# Patient Record
Sex: Female | Born: 1990 | Race: Black or African American | Hispanic: No | Marital: Single | Smoking: Never smoker
Health system: Southern US, Community
[De-identification: ages and names within clinical notes are randomized; demographics above are authoritative.]

## PROBLEM LIST (undated history)

## (undated) HISTORY — PX: BREAST SURGERY: SHX581

## (undated) HISTORY — PX: CHOLECYSTECTOMY: SHX55

## (undated) HISTORY — PX: INDUCED ABORTION: SHX677

---
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
## 2009-10-27 IMAGING — CR DG CHEST 2V
2 series · 2 of 2 positions shown · non-contrast
Comparison: None

CLINICAL DATA: Left-sided chest pain.

CHEST - 2 VIEW

[w chest pa]
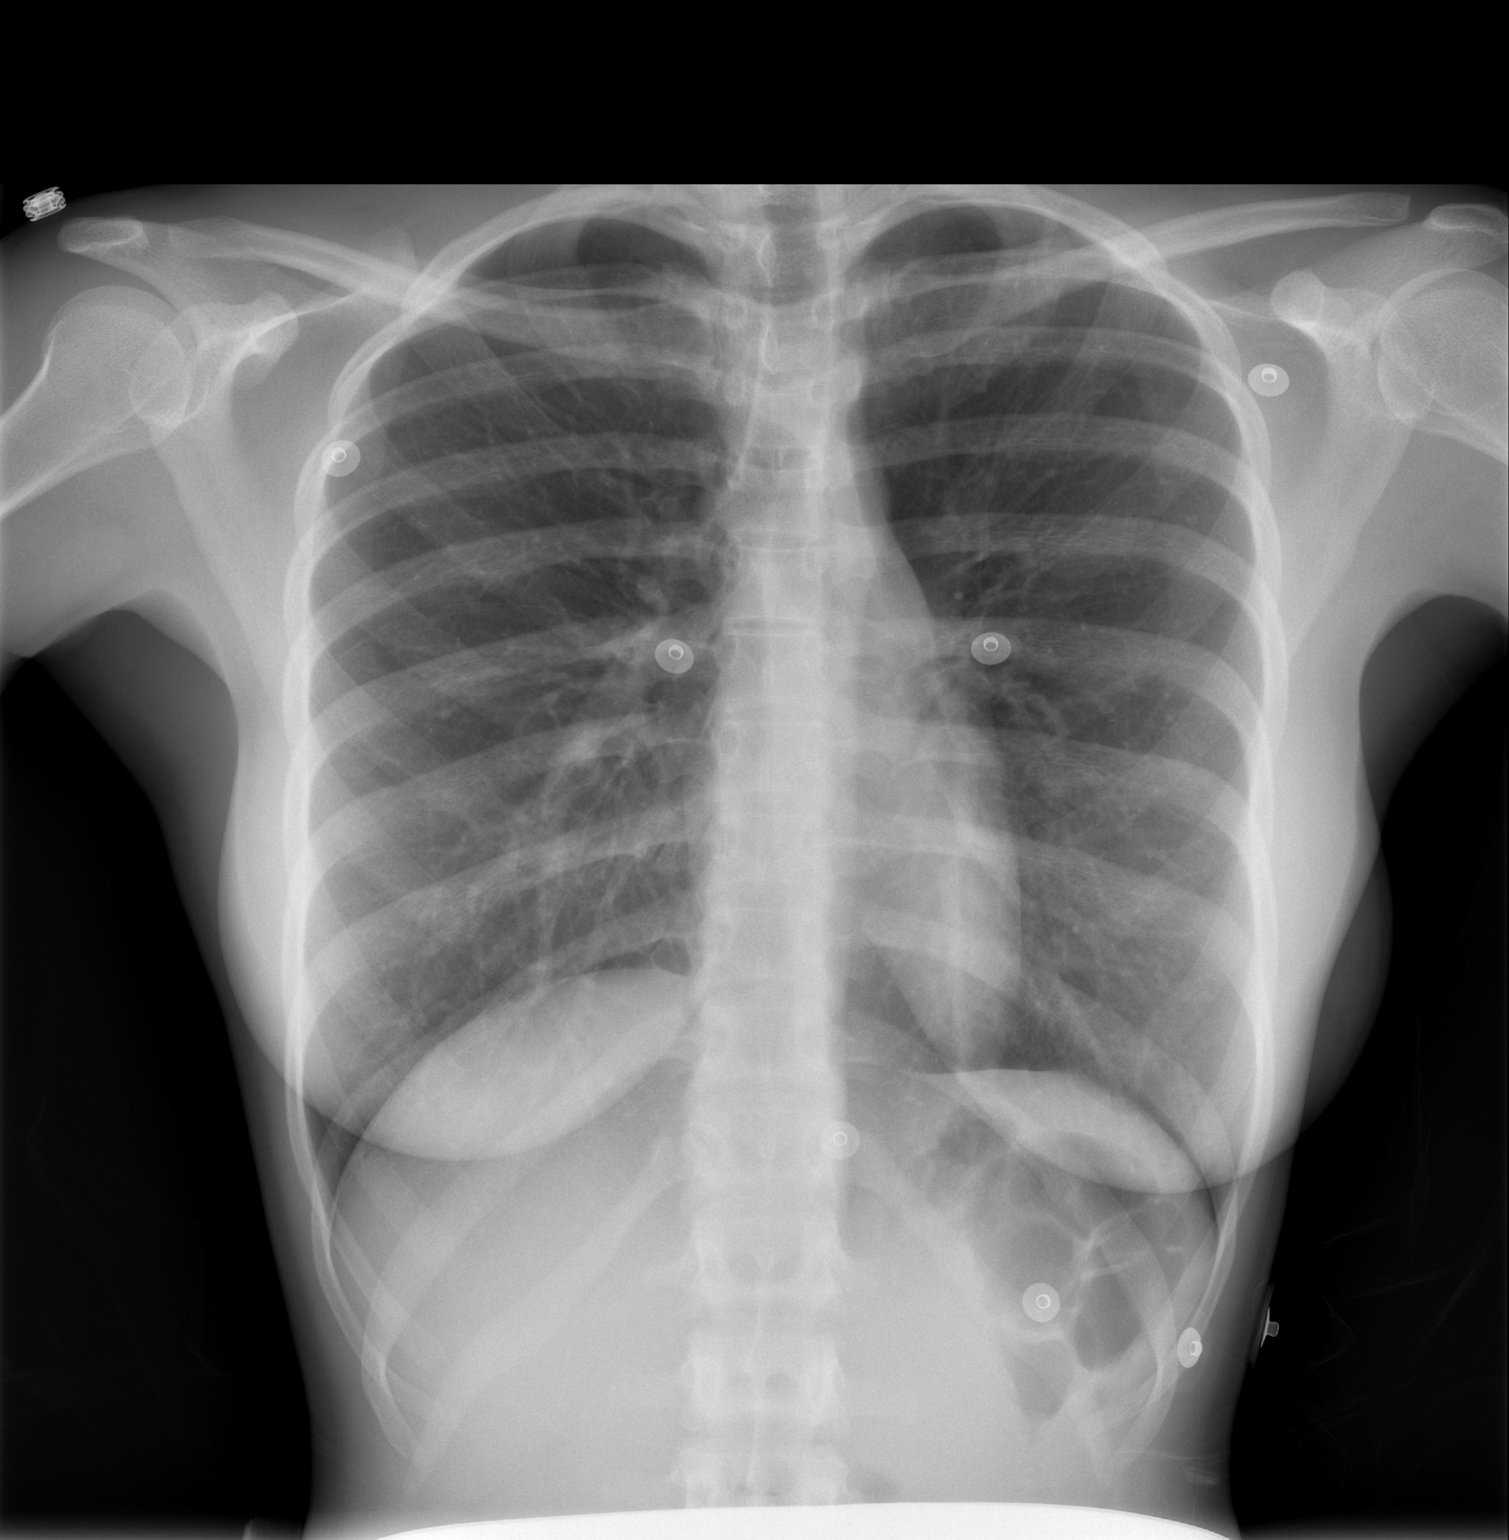

[w chest lat]
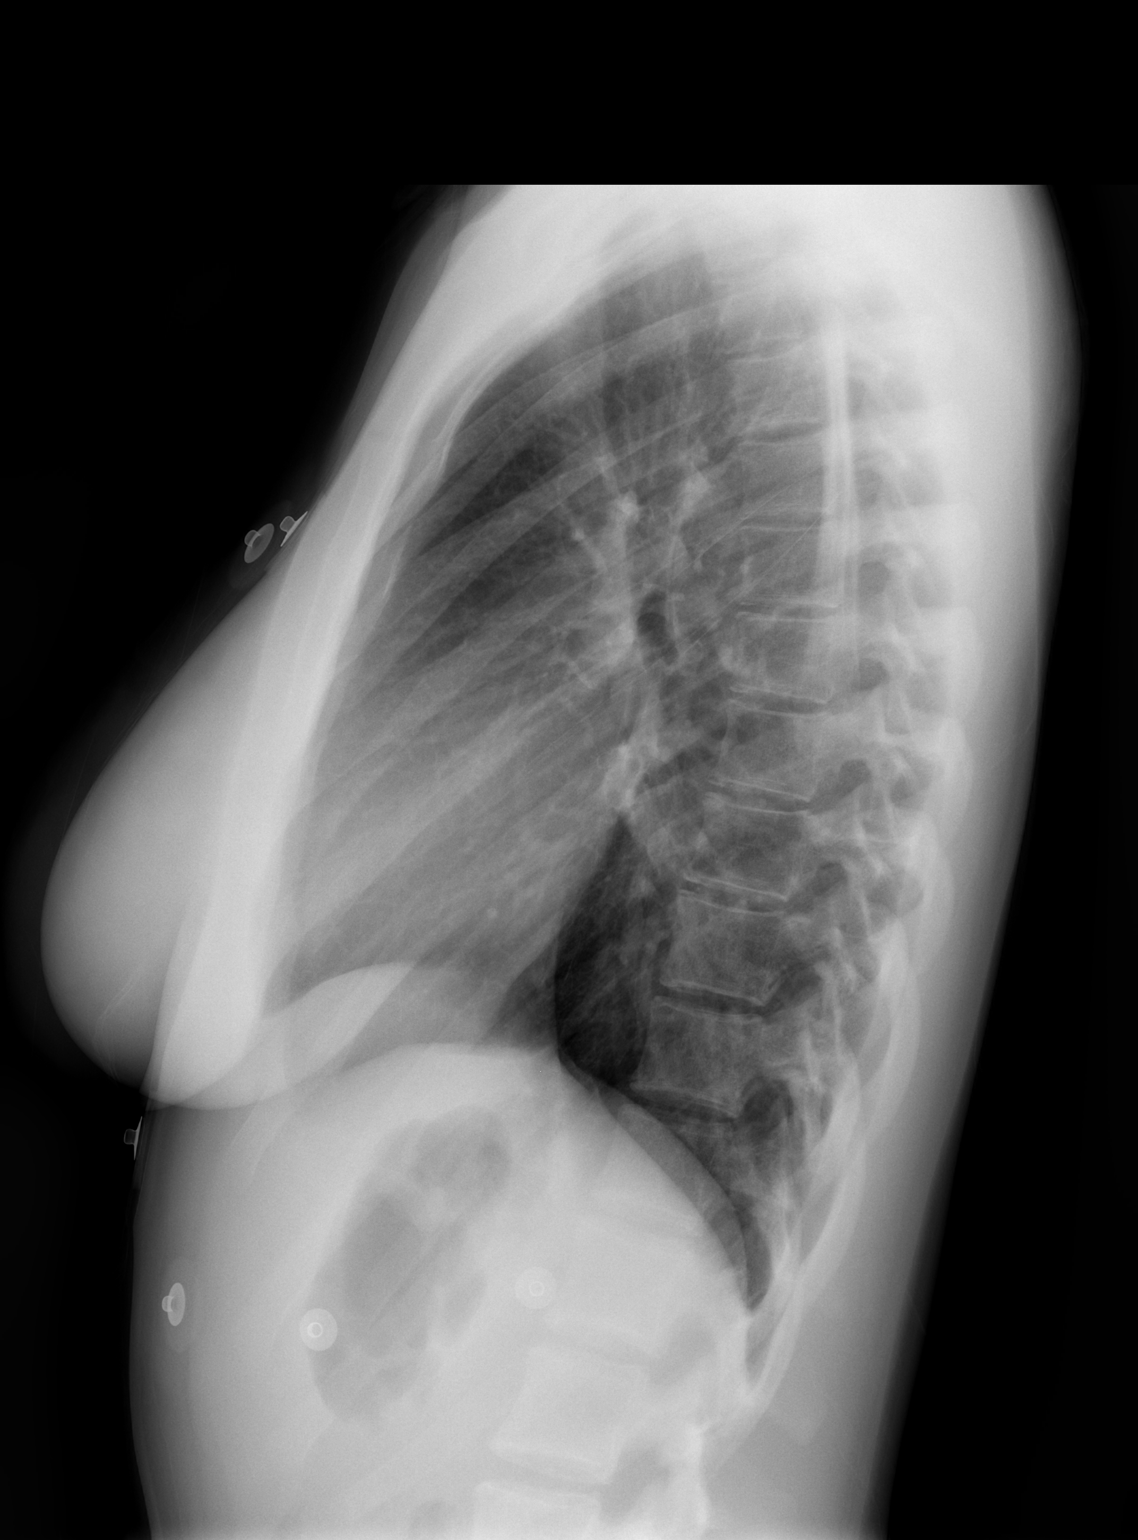

[2 of 2 positions shown; findings below may reference images not displayed]

FINDINGS: Heart and mediastinal contours are within normal limits.
No focal opacities or effusions.  No acute bony abnormality.
IMPRESSION: No active disease.

## 2010-08-18 ENCOUNTER — Emergency Department (HOSPITAL_BASED_OUTPATIENT_CLINIC_OR_DEPARTMENT_OTHER)
Admission: EM | Admit: 2010-08-18 | Discharge: 2010-08-18 | Disposition: A | Discharge status: Home / Self Care | Payer: BC Managed Care – PPO | Attending: Emergency Medicine | Admitting: Emergency Medicine

## 2010-08-18 DIAGNOSIS — Y92009 Unspecified place in unspecified non-institutional (private) residence as the place of occurrence of the external cause: Secondary | ICD-10-CM | POA: Insufficient documentation

## 2010-08-18 DIAGNOSIS — X118XXA Contact with other hot tap-water, initial encounter: Secondary | ICD-10-CM | POA: Insufficient documentation

## 2010-08-18 DIAGNOSIS — T2124XA Burn of second degree of lower back, initial encounter: Secondary | ICD-10-CM | POA: Insufficient documentation

## 2011-03-19 LAB — URINALYSIS, ROUTINE W REFLEX MICROSCOPIC
Ketones, ur: NEGATIVE
Nitrite: NEGATIVE
Protein, ur: NEGATIVE
Urobilinogen, UA: 0.2

## 2011-03-19 LAB — POCT CARDIAC MARKERS
CKMB, poc: 1
CKMB, poc: <1 — ABNORMAL LOW
Troponin i, poc: <0.05
Troponin i, poc: <0.05

## 2011-03-19 LAB — DIFFERENTIAL
Basophils Absolute: 0.1
Basophils Relative: 1
Eosinophils Relative: 2
Lymphocytes Relative: 40
Monocytes Absolute: 0.5
Monocytes Relative: 8
Neutro Abs: 3.1

## 2011-03-19 LAB — COMPREHENSIVE METABOLIC PANEL
AST: 24
Albumin: 4.4
Alkaline Phosphatase: 73
BUN: 10
Chloride: 106
GFR calc Af Amer: >60
Potassium: 3.8
Total Bilirubin: 0.3
Total Protein: 7.6

## 2011-03-19 LAB — CBC
Platelets: 271
RDW: 13.5
WBC: 6.2

## 2011-03-19 LAB — PREGNANCY, URINE: Preg Test, Ur: NEGATIVE

## 2013-02-28 ENCOUNTER — Emergency Department (HOSPITAL_BASED_OUTPATIENT_CLINIC_OR_DEPARTMENT_OTHER)
Admission: EM | Admit: 2013-02-28 | Discharge: 2013-02-28 | Disposition: A | Discharge status: Home / Self Care | Payer: BC Managed Care – PPO | Attending: Emergency Medicine | Admitting: Emergency Medicine

## 2013-02-28 ENCOUNTER — Encounter (HOSPITAL_BASED_OUTPATIENT_CLINIC_OR_DEPARTMENT_OTHER): Payer: Self-pay | Admitting: Emergency Medicine

## 2013-02-28 DIAGNOSIS — N898 Other specified noninflammatory disorders of vagina: Secondary | ICD-10-CM | POA: Insufficient documentation

## 2013-02-28 DIAGNOSIS — Z3202 Encounter for pregnancy test, result negative: Secondary | ICD-10-CM | POA: Insufficient documentation

## 2013-02-28 DIAGNOSIS — N730 Acute parametritis and pelvic cellulitis: Secondary | ICD-10-CM | POA: Insufficient documentation

## 2013-02-28 LAB — URINALYSIS, ROUTINE W REFLEX MICROSCOPIC
Ketones, ur: 15 mg/dL — AB
Nitrite: NEGATIVE
Protein, ur: NEGATIVE mg/dL
Urobilinogen, UA: 1 mg/dL (ref 0.0–1.0)

## 2013-02-28 LAB — WET PREP, GENITAL: Yeast Wet Prep HPF POC: NONE SEEN

## 2013-02-28 MED ORDER — CEFOXITIN SODIUM 2 G IV SOLR
2.0000 g | Freq: Once | INTRAVENOUS | Status: AC
  Administered 2013-02-28: 2 g via INTRAVENOUS
  Filled 2013-02-28: qty 2

## 2013-02-28 MED ORDER — DOXYCYCLINE HYCLATE 100 MG PO TABS
100.0000 mg | ORAL_TABLET | Freq: Two times a day (BID) | ORAL | Status: DC
  Administered 2013-02-28: 100 mg via ORAL
  Filled 2013-02-28: qty 1

## 2013-02-28 MED ORDER — METRONIDAZOLE 500 MG PO TABS
2000.0000 mg | ORAL_TABLET | Freq: Once | ORAL | Status: AC
  Administered 2013-02-28: 2000 mg via ORAL
  Filled 2013-02-28: qty 4

## 2013-02-28 MED ORDER — DOXYCYCLINE HYCLATE 100 MG PO CAPS: 100.0000 mg | ORAL_CAPSULE | Freq: Two times a day (BID) | ORAL | Status: AC

## 2013-02-28 MED ORDER — PROBENECID 500 MG PO TABS
1000.0000 mg | ORAL_TABLET | Freq: Two times a day (BID) | ORAL | Status: DC
  Filled 2013-02-28: qty 2

## 2013-02-28 MED ORDER — LIDOCAINE HCL (PF) 1 % IJ SOLN
INTRAMUSCULAR | Status: AC
  Administered 2013-02-28: 2 mL
  Filled 2013-02-28: qty 5

## 2013-02-28 NOTE — ED Notes (Signed)
Patient asking to see Nurse.  Advised pt I would send RN in.

## 2013-02-28 NOTE — ED Notes (Signed)
MD at bedside. 

## 2013-02-28 NOTE — ED Notes (Signed)
C/o changes to vaginal discharge x three months.  Now c/o heavy vaginal bleeding with cramps.  States irregular menstrual cycle, last one 2 months ago.  Bleeding started yesterday, now resolved.

## 2013-02-28 NOTE — ED Provider Notes (Signed)
CSN: [id number]     Arrival date & time 02/28/13  1036 History   First MD Initiated Contact with Patient 02/28/13 1108     Chief Complaint  Patient presents with  . Menstrual Problem   (Consider location/radiation/quality/duration/timing/severity/associated sxs/prior Treatment) HPI Patient presents with lower abdominal pain, vaginal discharge bleeding. She has IUD in place, with for one year. She states over the past 4 days she has had intermittent bleeding, discharge, with lower abdominal pain. No current weakness anywhere, no chest pain, no dyspnea, no fever, no chills, no vomiting, no diarrhea. The abdominal pain is lower, crampy, diffuse, occurring independently of bleeding exacerbations. There is no clear alleviating or exacerbating factor for the abdominal pain. History reviewed. No pertinent past medical history. Past Surgical History  Procedure Laterality Date  . Breast surgery      Abscess to left breast   No family history on file. History  Substance Use Topics  . Smoking status: Never Smoker   . Smokeless tobacco: Not on file  . Alcohol Use: Yes     Comment: Ocassionally   OB History   Grav Para Term Preterm Abortions TAB SAB Ect Mult Living   1 1             Review of Systems  Constitutional:       Per HPI, otherwise negative  HENT:       Per HPI, otherwise negative  Respiratory:       Per HPI, otherwise negative  Cardiovascular:       Per HPI, otherwise negative  Gastrointestinal: Negative for vomiting.  Endocrine:       Negative aside from HPI  Genitourinary:       Neg aside from HPI   Musculoskeletal:       Per HPI, otherwise negative  Skin: Negative.   Neurological: Negative for syncope.    Allergies  Review of patient's allergies indicates no known allergies.  Home Medications  No current outpatient prescriptions on file. BP 115/60  Pulse 73  Temp(Src) 99.4 F (37.4 C) (Oral)  Resp 18  Wt 147 lb (66.679 kg)  SpO2 100%  LMP  02/27/2013 Physical Exam  Nursing note and vitals reviewed. Constitutional: She is oriented to person, place, and time. She appears well-developed and well-nourished. No distress.  HENT:  Head: Normocephalic and atraumatic.  Eyes: Conjunctivae and EOM are normal.  Cardiovascular: Normal rate and regular rhythm.   Pulmonary/Chest: Effort normal and breath sounds normal. No stridor. No respiratory distress.  Abdominal: She exhibits no distension. There is tenderness in the right lower quadrant, suprapubic area and left lower quadrant. There is no rigidity, no rebound and no guarding. Hernia confirmed negative in the right inguinal area and confirmed negative in the left inguinal area.  Genitourinary: There is no rash, tenderness or lesion on the right labia. There is no rash, tenderness or lesion on the left labia. There is tenderness around the vagina. No bleeding around the vagina. No foreign body around the vagina. Vaginal discharge found.  Copious discharge within the vaginal vault with cervical motion tenderness.  There is mild adnexal tenderness bilaterally.  Musculoskeletal: She exhibits no edema.  Lymphadenopathy:       Right: No inguinal adenopathy present.       Left: No inguinal adenopathy present.  Neurological: She is alert and oriented to person, place, and time. No cranial nerve deficit.  Skin: Skin is warm and dry.  Psychiatric: She has a normal mood and affect.  ED Course  Pelvic exam Date/Time: 02/28/2013 11:47 AM Performed by: Gerhard Munch Authorized by: Gerhard Munch Consent: Verbal consent obtained. The procedure was performed in an emergent situation. Risks and benefits: risks, benefits and alternatives were discussed Consent given by: patient Patient understanding: patient states understanding of the procedure being performed Patient consent: the patient's understanding of the procedure matches consent given Procedure consent: procedure consent matches  procedure scheduled Relevant documents: relevant documents present and verified Test results: test results available and properly labeled Site marked: the operative site was marked Imaging studies: imaging studies available Required items: required blood products, implants, devices, and special equipment available Patient identity confirmed: verbally with patient Time out: Immediately prior to procedure a "time out" was called to verify the correct patient, procedure, equipment, support staff and site/side marked as required. Preparation: Patient was prepped and draped in the usual sterile fashion. Local anesthesia used: no Patient sedated: no Patient tolerance: Patient tolerated the procedure well with no immediate complications.   (including critical care time) Labs Review Labs Reviewed  URINALYSIS, ROUTINE W REFLEX MICROSCOPIC - Abnormal; Notable for the following:    APPearance CLOUDY (*)    Specific Gravity, Urine 1.036 (*)    Bilirubin Urine SMALL (*)    Ketones, ur 15 (*)    Leukocytes, UA TRACE (*)    All other components within normal limits  URINE MICROSCOPIC-ADD ON - Abnormal; Notable for the following:    Bacteria, UA MANY (*)    All other components within normal limits  URINE CULTURE  GC/CHLAMYDIA PROBE AMP  WET PREP, GENITAL  PREGNANCY, URINE   Imaging Review No results found. pulse oximetry 100% room air normal   MDM  No diagnosis found. This female presents with several days of lower abdominal pain, discharge, vaginal bleeding.  On exam she is awake and alert, afebrile, clearly in no distress. However, the patient does have copious vaginal discharge, labs consistent with bacterial vaginosis, and given the mild cervical motion tenderness on exam, she was treated with antibiotics.  She has a gynecologist with whom she can followup    Gerhard Munch, MD 02/28/13 1227

## 2013-03-03 LAB — URINE CULTURE: Colony Count: 9000

## 2013-03-04 NOTE — ED Notes (Signed)
+   Chlamydia Patient treated with Cefoxitin And  Doxycyline-[facility] faxed

## 2013-03-05 ENCOUNTER — Telehealth (HOSPITAL_COMMUNITY): Payer: Self-pay | Admitting: *Deleted

## 2013-03-08 ENCOUNTER — Telehealth (HOSPITAL_COMMUNITY): Payer: Self-pay | Admitting: Emergency Medicine

## 2013-03-10 NOTE — ED Notes (Signed)
Unable to contact via phone.'Letter sent to [facility] address. 

## 2013-04-07 ENCOUNTER — Encounter (HOSPITAL_BASED_OUTPATIENT_CLINIC_OR_DEPARTMENT_OTHER): Payer: Self-pay | Admitting: Emergency Medicine

## 2013-04-07 ENCOUNTER — Emergency Department (HOSPITAL_BASED_OUTPATIENT_CLINIC_OR_DEPARTMENT_OTHER)
Admission: EM | Admit: 2013-04-07 | Discharge: 2013-04-07 | Disposition: A | Discharge status: Home / Self Care | Payer: 59 | Attending: Emergency Medicine | Admitting: Emergency Medicine

## 2013-04-07 DIAGNOSIS — J020 Streptococcal pharyngitis: Secondary | ICD-10-CM

## 2013-04-07 LAB — RAPID STREP SCREEN (MED CTR MEBANE ONLY): Streptococcus, Group A Screen (Direct): POSITIVE — AB

## 2013-04-07 MED ORDER — NAPROXEN 500 MG PO TABS: 500.0000 mg | ORAL_TABLET | Freq: Two times a day (BID) | ORAL | Status: DC

## 2013-04-07 MED ORDER — PENICILLIN V POTASSIUM 500 MG PO TABS: 500.0000 mg | ORAL_TABLET | Freq: Four times a day (QID) | ORAL | Status: DC

## 2013-04-07 NOTE — ED Notes (Signed)
Patient c/o sore throat/hurts to swall, took nyquil last night, but throat feels worse today, vomited this morning

## 2013-04-07 NOTE — ED Provider Notes (Signed)
CSN: [id number]     Arrival date & time 04/07/13  4098 History   First MD Initiated Contact with Patient 04/07/13 [phone]     Chief Complaint  Patient presents with  . Sore Throat   (Consider location/radiation/quality/duration/timing/severity/associated sxs/prior Treatment) Patient is a 22 y.o. female presenting with pharyngitis. The history is provided by the patient.  Sore Throat Pertinent negatives include no chest pain, no abdominal pain, no headaches and no shortness of breath.   patient with onset of sore throat yesterday worse today. Took NyQuil overnight without significant relief. No past history strep throat. The pain is worse is 10 out of 10 currently a 10. Sore worse with swallowing. No fevers no congestion no cough no rash no neck stiffness no significant headache patient without any exposure to anyone from [place] and no recent travel outside the [place].  History reviewed. No pertinent past medical history. Past Surgical History  Procedure Laterality Date  . Breast surgery      Abscess to left breast   No family history on file. History  Substance Use Topics  . Smoking status: Never Smoker   . Smokeless tobacco: Not on file  . Alcohol Use: Yes     Comment: Ocassionally   OB History   Grav Para Term Preterm Abortions TAB SAB Ect Mult Living   1 1             Review of Systems  Constitutional: Negative for fever.  HENT: Positive for sore throat. Negative for congestion.   Respiratory: Negative for shortness of breath.   Cardiovascular: Negative for chest pain.  Gastrointestinal: Negative for abdominal pain.  Genitourinary: Negative for hematuria.  Musculoskeletal: Negative for back pain and neck pain.  Skin: Negative for rash.  Neurological: Negative for headaches.  Hematological: Does not bruise/bleed easily.  Psychiatric/Behavioral: Negative for confusion.    Allergies  Review of patient's allergies indicates no known allergies.  Home  Medications   Current Outpatient Rx  Name  Route  Sig  Dispense  Refill  . naproxen (NAPROSYN) 500 MG tablet   Oral   Take 1 tablet (500 mg total) by mouth 2 (two) times daily.   14 tablet   0   . penicillin v potassium (VEETID) 500 MG tablet   Oral   Take 1 tablet (500 mg total) by mouth 4 (four) times daily.   28 tablet   0    BP 111/44  Pulse 90  Temp(Src) 98.9 F (37.2 C)  Resp 18  SpO2 100% Physical Exam  Nursing note and vitals reviewed. Constitutional: She is oriented to person, place, and time. She appears well-developed and well-nourished. No distress.  HENT:  Head: Normocephalic and atraumatic.  Mouth/Throat: Oropharynx is clear and moist.  Pharynx is very red uvula midline no tonsillar exudate.  Eyes: Conjunctivae and EOM are normal. Pupils are equal, round, and reactive to light.  Neck: Normal range of motion. Neck supple.  Cardiovascular: Normal rate, regular rhythm and intact distal pulses.   Pulmonary/Chest: Effort normal and breath sounds normal. No respiratory distress.  Abdominal: Soft. Bowel sounds are normal. There is no tenderness.  Musculoskeletal: Normal range of motion.  Lymphadenopathy:    She has cervical adenopathy.  Neurological: She is alert and oriented to person, place, and time. No cranial nerve deficit. She exhibits normal muscle tone. Coordination normal.  Skin: Skin is warm. No rash noted.    ED Course  Procedures (including critical care time) Labs Review Labs Reviewed  RAPID STREP SCREEN - Abnormal; Notable for the following:    Streptococcus, Group A Screen (Direct) POSITIVE (*)    All other components within normal limits   Imaging Review No results found.  EKG Interpretation   None       MDM   1. Strep pharyngitis    Strep test consistent with strep pharyngitis. Patient without any complicating factors no significant fever no significant rash. We'll treat with penicillin and anti-inflammatory medicine. Work note  provided to be off for 24 hours.    Shelda Jakes, MD 04/07/13 1024

## 2013-04-07 NOTE — ED Notes (Signed)
rec'd call stating pt did not have her rx, reviewed AVS and called in PCN V and naproxen to [facility] in [place]. Pt made aware.

## 2014-04-19 ENCOUNTER — Encounter (HOSPITAL_BASED_OUTPATIENT_CLINIC_OR_DEPARTMENT_OTHER): Payer: Self-pay | Admitting: Emergency Medicine

## 2016-11-19 ENCOUNTER — Emergency Department (HOSPITAL_BASED_OUTPATIENT_CLINIC_OR_DEPARTMENT_OTHER)
Admission: EM | Admit: 2016-11-19 | Discharge: 2016-11-19 | Disposition: A | Discharge status: Home / Self Care | Payer: Self-pay | Attending: Emergency Medicine | Admitting: Emergency Medicine

## 2016-11-19 ENCOUNTER — Encounter (HOSPITAL_BASED_OUTPATIENT_CLINIC_OR_DEPARTMENT_OTHER): Payer: Self-pay | Admitting: Emergency Medicine

## 2016-11-19 DIAGNOSIS — K0889 Other specified disorders of teeth and supporting structures: Secondary | ICD-10-CM

## 2016-11-19 DIAGNOSIS — K029 Dental caries, unspecified: Secondary | ICD-10-CM | POA: Insufficient documentation

## 2016-11-19 MED ORDER — BUPIVACAINE-EPINEPHRINE (PF) 0.5% -1:200000 IJ SOLN
1.8000 mL | Freq: Once | INTRAMUSCULAR | Status: AC
  Administered 2016-11-19: 1.8 mL
  Filled 2016-11-19: qty 1.8

## 2016-11-19 MED ORDER — AMOXICILLIN 500 MG PO CAPS: 500.0000 mg | ORAL_CAPSULE | Freq: Three times a day (TID) | ORAL | 0 refills | Status: DC

## 2016-11-19 NOTE — Discharge Instructions (Signed)
Take the antibiotics prescribed. Alternate  Motrin Tylenol. Follow-up with the dentist this week. May use over the counter orajel.

## 2016-11-19 NOTE — ED Triage Notes (Signed)
Patient states that she has pain to her mouth and a broken tooth

## 2016-11-19 NOTE — ED Provider Notes (Signed)
[facility] Provider Note   CSN: [id number] Arrival date & time: 11/19/16  1813  By signing my name below, I, [name], attest that this documentation has been prepared under the direction and in the presence of Rise MuKenneth T. Demarkis Gheen, PA-C. Electronically Signed: [name], ED Scribe. 11/19/16. 6:51 PM.  History   Chief Complaint Chief Complaint  Patient presents with  . Dental Pain   The history is provided by the patient. No language interpreter was used.   HPI Comments: Olivia Morton is a 26 y.o. female who presents to the Emergency Department complaining of gradually worsening, moderate upper left tooth pain that worsened yesterday morning. She reports that she did not notice that the tooth was fractured until recently. The pt indicates that she has a regular dentist where she was last seen 9 months ago. [facility] her pain with inadequate relief. The pt denies fever and trouble swallowing.  History reviewed. No pertinent past medical history.  There are no active problems to display for this patient.   Past Surgical History:  Procedure Laterality Date  . BREAST SURGERY     Abscess to left breast    OB History    Gravida Para Term Preterm AB Living   1 1           SAB TAB Ectopic Multiple Live Births                   Home Medications    Prior to Admission medications   Medication Sig Start Date End Date Taking? Authorizing Provider  naproxen (NAPROSYN) 500 MG tablet Take 1 tablet (500 mg total) by mouth 2 (two) times daily. 04/07/13   Vanetta MuldersZackowski, Scott, MD  penicillin v potassium (VEETID) 500 MG tablet Take 1 tablet (500 mg total) by mouth 4 (four) times daily. 04/07/13   Vanetta MuldersZackowski, Scott, MD     Family History History reviewed. No pertinent family history.  Social History Social History  Substance Use Topics  . Smoking status: Never Smoker  . Smokeless tobacco: Never Used  . Alcohol use Yes     Comment:  Ocassionally     Allergies   Patient has no known allergies.   Review of Systems Review of Systems  Constitutional: Negative for fever.  HENT: Positive for dental problem. Negative for trouble swallowing.      Physical Exam Updated Vital Signs BP 129/85 (BP Location: Right Arm)   Pulse 78   Temp 99.8 F (37.7 C) (Oral)   Resp 18   Ht 5\' 7"  (1.702 m)   Wt 180 lb (81.6 kg)   SpO2 100%   BMI 28.19 kg/m   Physical Exam  Constitutional: She is oriented to person, place, and time. She appears well-developed and well-nourished.  HENT:  Head: Normocephalic.  Mouth/Throat: Uvula is midline, oropharynx is clear and moist and mucous membranes are normal. No trismus in the jaw. Dental caries present. No uvula swelling.    No associated facial swelling. No erythema. Patient is managing secretions and maintaining her airway speaking complete sentences. No sublingual or submandibular swelling.  Eyes: EOM are normal.  Neck: Normal range of motion. Neck supple.  Musculoskeletal: Normal range of motion.  Lymphadenopathy:    She has no cervical adenopathy.  Neurological: She is alert and oriented to person, place, and time.  Psychiatric: She has a normal mood and affect.  Nursing note and vitals reviewed.    ED Treatments / Results  DIAGNOSTIC STUDIES: Oxygen Saturation is 100% on RA, normal by my interpretation.   COORDINATION OF CARE: 6:44 PM-Discussed next steps with pt including antibiotics. Pt verbalized understanding and is agreeable with the plan.   Labs (all labs ordered are listed, but only abnormal results are displayed) Labs Reviewed - No data to display  EKG  EKG Interpretation None       Radiology No results found.  Procedures Dental Block Date/Time: 11/20/2016 3:52 PM Performed by: Demetrios Loll T Authorized by: Demetrios Loll T   Consent:    Consent obtained:  Verbal   Consent given by:  Patient   Risks discussed:  Allergic reaction,  hematoma, intravascular injection, infection, nerve damage, pain, unsuccessful block and swelling   Alternatives discussed:  No treatment Indications:    Indications: dental pain   Location:    Block type:  Supraperiosteal   Supraperiosteal location:  Upper teeth   Upper teeth location:  14/LU 1st molar Procedure details (see MAR for exact dosages):    Needle gauge:  25 G   Anesthetic injected:  Bupivacaine 0.25% WITH epi   Injection procedure:  Anatomic landmarks identified, introduced needle, incremental injection, negative aspiration for blood and anatomic landmarks palpated Post-procedure details:    Outcome:  Pain relieved   Patient tolerance of procedure:  Tolerated well, no immediate complications   (including critical care time)  Medications Ordered in ED Medications - No data to display   Initial Impression / Assessment and Plan / ED Course  I have reviewed the triage vital signs and the nursing notes.  Pertinent labs & imaging results that were available during my care of the patient were reviewed by me and considered in my medical decision making (see chart for details).     Patient with toothache.  No gross abscess.  Exam unconcerning for Ludwig's angina or spread of infection.  Will treat with penicillin and pain medicine.  Urged patient to follow-up with dentist.     Final Clinical Impressions(s) / ED Diagnoses   Final diagnoses:  Pain, dental    New Prescriptions Discharge Medication List as of 11/19/2016  7:16 PM    START taking these medications   Details  amoxicillin (AMOXIL) 500 MG capsule Take 1 capsule (500 mg total) by mouth 3 (three) times daily., Starting Mon 11/19/2016, Print       I personally performed the services described in this documentation, which was scribed in my presence. The recorded information has been reviewed and is accurate.    Rise Mu, PA-C 11/20/16 1553    Pricilla Loveless, MD 11/22/16 [phone]

## 2016-11-19 NOTE — ED Notes (Signed)
ED Provider at bedside. 

## 2018-06-18 DIAGNOSIS — N730 Acute parametritis and pelvic cellulitis: Secondary | ICD-10-CM

## 2018-06-18 HISTORY — DX: Acute parametritis and pelvic cellulitis: N73.0

## 2018-12-15 ENCOUNTER — Other Ambulatory Visit: Payer: Self-pay

## 2018-12-15 ENCOUNTER — Encounter (HOSPITAL_BASED_OUTPATIENT_CLINIC_OR_DEPARTMENT_OTHER): Payer: Self-pay

## 2018-12-15 ENCOUNTER — Emergency Department (HOSPITAL_BASED_OUTPATIENT_CLINIC_OR_DEPARTMENT_OTHER)
Admission: EM | Admit: 2018-12-15 | Discharge: 2018-12-16 | Disposition: A | Discharge status: Home / Self Care | Payer: BLUE CROSS/BLUE SHIELD | Attending: Emergency Medicine | Admitting: Emergency Medicine

## 2018-12-15 DIAGNOSIS — N939 Abnormal uterine and vaginal bleeding, unspecified: Secondary | ICD-10-CM | POA: Diagnosis present

## 2018-12-15 DIAGNOSIS — N730 Acute parametritis and pelvic cellulitis: Secondary | ICD-10-CM | POA: Diagnosis not present

## 2018-12-15 DIAGNOSIS — Z79899 Other long term (current) drug therapy: Secondary | ICD-10-CM | POA: Diagnosis not present

## 2018-12-15 NOTE — ED Triage Notes (Signed)
Pt reports vaginal bleeding. Pt also noticed small clots. Pt reports she woke up to "a lot of blood." Then it stopped and tonight pt reports another "gush of blood." Pt c/o lower back pain.

## 2018-12-15 NOTE — ED Notes (Signed)
Pelvic at bedside   
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
## 2018-12-16 LAB — URINALYSIS, ROUTINE W REFLEX MICROSCOPIC
Bilirubin Urine: NEGATIVE
Glucose, UA: NEGATIVE mg/dL
Ketones, ur: NEGATIVE mg/dL
Leukocytes,Ua: NEGATIVE
Nitrite: NEGATIVE
Protein, ur: NEGATIVE mg/dL
Specific Gravity, Urine: >1.03 — ABNORMAL HIGH (ref 1.005–1.030)
pH: 6 (ref 5.0–8.0)

## 2018-12-16 LAB — URINALYSIS, MICROSCOPIC (REFLEX): RBC / HPF: >50 RBC/hpf (ref 0–5)

## 2018-12-16 LAB — WET PREP, GENITAL
Sperm: NONE SEEN
Yeast Wet Prep HPF POC: NONE SEEN

## 2018-12-16 LAB — PREGNANCY, URINE: Preg Test, Ur: NEGATIVE

## 2018-12-16 MED ORDER — DOXYCYCLINE HYCLATE 100 MG PO CAPS: 100.0000 mg | ORAL_CAPSULE | Freq: Two times a day (BID) | ORAL | 0 refills | Status: DC

## 2018-12-16 MED ORDER — IBUPROFEN 600 MG PO TABS: 600.0000 mg | ORAL_TABLET | Freq: Four times a day (QID) | ORAL | 0 refills | Status: DC | PRN

## 2018-12-16 MED ORDER — AZITHROMYCIN 250 MG PO TABS
1000.0000 mg | ORAL_TABLET | Freq: Once | ORAL | Status: AC
  Administered 2018-12-16: 1000 mg via ORAL
  Filled 2018-12-16: qty 4

## 2018-12-16 MED ORDER — METRONIDAZOLE 500 MG PO TABS
2000.0000 mg | ORAL_TABLET | Freq: Once | ORAL | Status: AC
  Administered 2018-12-16: 2000 mg via ORAL
  Filled 2018-12-16: qty 4

## 2018-12-16 MED ORDER — CEFTRIAXONE SODIUM 250 MG IJ SOLR
250.0000 mg | Freq: Once | INTRAMUSCULAR | Status: AC
  Administered 2018-12-16: 250 mg via INTRAMUSCULAR
  Filled 2018-12-16: qty 250

## 2018-12-16 NOTE — Discharge Instructions (Addendum)
We saw in the ER for pelvic discomfort and vaginal bleeding.  The work-up in the ER does reveal that you have trichomonas infection.  Trichomonas is a STD, and therefore based on CDC guideline we have given you antibiotics in the ER that covers the most common STDs including Trichomonas.  Given the pelvic pain on exam, you will also need to take doxycycline as prescribed.  Return to the ER if you start having fevers, chills worsening of the bleed. The bleeding you are having is because of the infection and therefore there is no need for ultrasound anymore. it should get better with the antibiotics.

## 2018-12-16 NOTE — ED Notes (Signed)
ED Provider at bedside. 

## 2018-12-17 LAB — GC/CHLAMYDIA PROBE AMP (~~LOC~~) NOT AT ARMC
Chlamydia: NEGATIVE
Neisseria Gonorrhea: NEGATIVE

## 2019-12-03 ENCOUNTER — Encounter (HOSPITAL_BASED_OUTPATIENT_CLINIC_OR_DEPARTMENT_OTHER): Payer: Self-pay | Admitting: *Deleted

## 2019-12-03 ENCOUNTER — Emergency Department (HOSPITAL_BASED_OUTPATIENT_CLINIC_OR_DEPARTMENT_OTHER)
Admission: EM | Admit: 2019-12-03 | Discharge: 2019-12-03 | Disposition: A | Discharge status: Home / Self Care | Payer: Self-pay | Attending: Emergency Medicine | Admitting: Emergency Medicine

## 2019-12-03 ENCOUNTER — Other Ambulatory Visit: Payer: Self-pay

## 2019-12-03 ENCOUNTER — Emergency Department (HOSPITAL_BASED_OUTPATIENT_CLINIC_OR_DEPARTMENT_OTHER): Payer: Self-pay

## 2019-12-03 DIAGNOSIS — R079 Chest pain, unspecified: Secondary | ICD-10-CM

## 2019-12-03 DIAGNOSIS — Z3202 Encounter for pregnancy test, result negative: Secondary | ICD-10-CM | POA: Insufficient documentation

## 2019-12-03 DIAGNOSIS — R0789 Other chest pain: Secondary | ICD-10-CM | POA: Insufficient documentation

## 2019-12-03 DIAGNOSIS — Z20822 Contact with and (suspected) exposure to covid-19: Secondary | ICD-10-CM | POA: Insufficient documentation

## 2019-12-03 LAB — CBC WITH DIFFERENTIAL/PLATELET
Abs Immature Granulocytes: 0.01 10*3/uL (ref 0.00–0.07)
Basophils Absolute: 0 10*3/uL (ref 0.0–0.1)
Basophils Relative: 1 %
Eosinophils Absolute: 0.1 10*3/uL (ref 0.0–0.5)
Eosinophils Relative: 2 %
HCT: 40.5 % (ref 36.0–46.0)
Hemoglobin: 12.5 g/dL (ref 12.0–15.0)
Immature Granulocytes: 0 %
Lymphocytes Relative: 57 %
Lymphs Abs: 2.1 10*3/uL (ref 0.7–4.0)
MCH: 23.5 pg — ABNORMAL LOW (ref 26.0–34.0)
MCHC: 30.9 g/dL (ref 30.0–36.0)
MCV: 76 fL — ABNORMAL LOW (ref 80.0–100.0)
Monocytes Absolute: 0.3 10*3/uL (ref 0.1–1.0)
Monocytes Relative: 9 %
Neutro Abs: 1.1 10*3/uL — ABNORMAL LOW (ref 1.7–7.7)
Neutrophils Relative %: 31 %
Platelets: 252 10*3/uL (ref 150–400)
RBC: 5.33 MIL/uL — ABNORMAL HIGH (ref 3.87–5.11)
RDW: 13.6 % (ref 11.5–15.5)
Smear Review: NORMAL
WBC: 3.7 10*3/uL — ABNORMAL LOW (ref 4.0–10.5)
nRBC: 0 % (ref 0.0–0.2)

## 2019-12-03 LAB — COMPREHENSIVE METABOLIC PANEL
ALT: 11 U/L (ref 0–44)
AST: 15 U/L (ref 15–41)
Albumin: 4 g/dL (ref 3.5–5.0)
Alkaline Phosphatase: 48 U/L (ref 38–126)
Anion gap: 10 (ref 5–15)
BUN: 13 mg/dL (ref 6–20)
CO2: 25 mmol/L (ref 22–32)
Calcium: 8.8 mg/dL — ABNORMAL LOW (ref 8.9–10.3)
Chloride: 104 mmol/L (ref 98–111)
Creatinine, Ser: 0.78 mg/dL (ref 0.44–1.00)
GFR calc Af Amer: >60 mL/min (ref >60)
GFR calc non Af Amer: >60 mL/min (ref >60)
Glucose, Bld: 97 mg/dL (ref 70–99)
Potassium: 3.6 mmol/L (ref 3.5–5.1)
Sodium: 139 mmol/L (ref 135–145)
Total Bilirubin: 0.5 mg/dL (ref 0.3–1.2)
Total Protein: 7.3 g/dL (ref 6.5–8.1)

## 2019-12-03 LAB — TROPONIN I (HIGH SENSITIVITY): Troponin I (High Sensitivity): <2 ng/L (ref <18)

## 2019-12-03 LAB — SARS CORONAVIRUS 2 BY RT PCR (HOSPITAL ORDER, PERFORMED IN ~~LOC~~ HOSPITAL LAB): SARS Coronavirus 2: NEGATIVE

## 2019-12-03 LAB — PREGNANCY, URINE: Preg Test, Ur: NEGATIVE

## 2019-12-03 NOTE — ED Notes (Signed)
Trop discontinued by EDP.

## 2019-12-03 NOTE — ED Notes (Signed)
Discharged by Jon Gills, RN.

## 2019-12-03 NOTE — ED Triage Notes (Signed)
Woke up this morning with chest pain (mid chest).  Denies nausea, dizziness, lightheaded.

## 2019-12-03 NOTE — ED Provider Notes (Signed)
[place] EMERGENCY DEPARTMENT Provider Note   CSN: [id number] Arrival date & time: 12/03/19  0715     History Chief Complaint  Patient presents with   Chest Pain    Olivia Morton is a 29 y.o. female.  HPI      1 month ago sprayed new cleaner, nose began to run instantly For 2 weeks then improved Thursday, went and cleaned again Still having congestion, tried mucinex, theraflu Never take benadryl, tried it for 2 days but felt too sleepy, benadryl helped a lot more than sinus medicine Woke up today with pain in chest, woke up out of sleep CP felt like a sharp pressure, on left side of chest, if cough or talk can feel it.  Worse with moving, getting up, walking. IUD in [place], getting it out next week. On menses. Is expired. No shortness of breath, nausea, vomiting First time was wheezing, this time not wheezing Cough light and congestion No fevers, no body aches  No known sick contacts Has not had covid vaccine 53yr ago lost sense of smell  No leg pain or swelling/long trips car or airplane  mirena expired  No medical hx.  No fam medical hx of CAD No smoking/etoh/  History reviewed. No pertinent past medical history.  There are no problems to display for this patient.   Past Surgical History:  Procedure Laterality Date   BREAST SURGERY     Abscess to left breast   CHOLECYSTECTOMY       OB History    Gravida  1   Para  1   Term      Preterm      AB      Living        SAB      TAB      Ectopic      Multiple      Live Births              History reviewed. No pertinent family history.  Social History   Tobacco Use   Smoking status: Never Smoker   Smokeless tobacco: Never Used  Vaping Use   Vaping Use: Never used  Substance Use Topics   Alcohol use: Yes    Comment: Ocassionally   Drug use: Yes    Types: Marijuana    Comment: 2 days ago    Home Medications Prior to Admission medications   Not on File      Allergies    Grapeseed extract [nutritional supplements]  Review of Systems   Review of Systems  Constitutional: Negative for fever.  HENT: Positive for congestion. Negative for sore throat.   Eyes: Negative for visual disturbance.  Respiratory: Positive for cough. Negative for shortness of breath.   Cardiovascular: Positive for chest pain.  Gastrointestinal: Negative for abdominal pain, nausea and vomiting.  Genitourinary: Negative for difficulty urinating.  Musculoskeletal: Negative for back pain and neck pain.  Skin: Negative for rash.  Neurological: Negative for syncope and headaches.    Physical Exam Updated Vital Signs BP 127/76    Pulse 77    Temp 97.6 F (36.4 C) (Oral)    Resp 15    Ht 5\' 8"  (1.727 m)    Wt 69 kg    LMP 11/25/2019    SpO2 100%    BMI 23.13 kg/m   Physical Exam Vitals and nursing note reviewed.  Constitutional:      General: She is not in acute distress.    Appearance:  She is well-developed. She is not diaphoretic.  HENT:     Head: Normocephalic and atraumatic.  Eyes:     Conjunctiva/sclera: Conjunctivae normal.  Cardiovascular:     Rate and Rhythm: Normal rate and regular rhythm.     Heart sounds: Normal heart sounds. No murmur heard.  No friction rub. No gallop.   Pulmonary:     Effort: Pulmonary effort is normal. No respiratory distress.     Breath sounds: Normal breath sounds. No wheezing or rales.  Abdominal:     General: There is no distension.     Palpations: Abdomen is soft.     Tenderness: There is no abdominal tenderness. There is no guarding.  Musculoskeletal:        General: No tenderness.     Cervical back: Normal range of motion.  Skin:    General: Skin is warm and dry.     Findings: No erythema or rash.  Neurological:     Mental Status: She is alert and oriented to person, place, and time.     ED Results / Procedures / Treatments   Labs (all labs ordered are listed, but only abnormal results are displayed) Labs  Reviewed  CBC WITH DIFFERENTIAL/PLATELET - Abnormal; Notable for the following components:      Result Value   WBC 3.7 (*)    RBC 5.33 (*)    MCV 76.0 (*)    MCH 23.5 (*)    Neutro Abs 1.1 (*)    All other components within normal limits  COMPREHENSIVE METABOLIC PANEL - Abnormal; Notable for the following components:   Calcium 8.8 (*)    All other components within normal limits  SARS CORONAVIRUS 2 BY RT PCR (HOSPITAL ORDER, PERFORMED IN [facility])  PREGNANCY, URINE  TROPONIN I (HIGH SENSITIVITY)  TROPONIN I (HIGH SENSITIVITY)    EKG EKG Interpretation  Date/Time:  Thursday December 03 2019 07:26:39 EDT Ventricular Rate:  99 PR Interval:    QRS Duration: 86 QT Interval:  371 QTC Calculation: 477 R Axis:   68 Text Interpretation: Sinus rhythm Consider left atrial enlargement Minimal ST depression, inferior leads Borderline prolonged QT interval Baseline wander in lead(s) II III aVF V2 No previous ECGs available Confirmed by Alvira Monday ([id number]) on 12/03/2019 8:14:33 AM   Radiology DG Chest 2 View  Result Date: 12/03/2019 CLINICAL DATA:  Chest pain. Inhaled cleaning products 3 weeks ago with sinus congestion since. EXAM: CHEST - 2 VIEW COMPARISON:  04/08/2016 FINDINGS: Lungs are adequately inflated and otherwise clear. Cardiomediastinal silhouette and remainder of the exam is unchanged. IMPRESSION: No active cardiopulmonary disease. Electronically Signed   By: Elberta Fortis M.D.   On: 12/03/2019 08:29    Procedures Procedures (including critical care time)  Medications Ordered in ED Medications - No data to display  ED Course  I have reviewed the triage vital signs and the nursing notes.  Pertinent labs & imaging results that were available during my care of the patient were reviewed by me and considered in my medical decision making (see chart for details).    MDM Rules/Calculators/A&P                          29yo female presents with concern for  chest pain in setting of cough, congestion.  Differential diagnosis for chest pain includes pulmonary embolus, dissection, pneumothorax, pneumonia, ACS, myocarditis, pericarditis.  EKG was done and evaluate by me and showed no acute ST  changes and no signs of pericarditis. Chest x-ray was done and evaluated by me and radiology and showed no sign of pneumonia or pneumothorax. Patient is PERC negative and low risk [place] and have low suspicion for PE.  Patient is low risk HEART score and had negative troponin after hours of chest pain and doubt ACS.  Do not feel history or exam are consistent with aortic dissection.   COVID 19 testing ordered and pending.  Possible other viral or allergic etiology of symptoms if COVID 19 testing negative.  CP may be musculoskeletal. Recommend follow up with PCP.    Final Clinical Impression(s) / ED Diagnoses Final diagnoses:  Chest pain, unspecified type    Rx / DC Orders ED Discharge Orders    None       Alvira Monday, MD 12/03/19 2128

## 2020-03-14 ENCOUNTER — Other Ambulatory Visit: Payer: Self-pay

## 2020-03-14 ENCOUNTER — Encounter (HOSPITAL_BASED_OUTPATIENT_CLINIC_OR_DEPARTMENT_OTHER): Payer: Self-pay | Admitting: Emergency Medicine

## 2020-03-14 ENCOUNTER — Emergency Department (HOSPITAL_BASED_OUTPATIENT_CLINIC_OR_DEPARTMENT_OTHER): Payer: Self-pay

## 2020-03-14 DIAGNOSIS — S62346A Nondisplaced fracture of base of fifth metacarpal bone, right hand, initial encounter for closed fracture: Secondary | ICD-10-CM | POA: Insufficient documentation

## 2020-03-14 DIAGNOSIS — W228XXA Striking against or struck by other objects, initial encounter: Secondary | ICD-10-CM | POA: Insufficient documentation

## 2020-03-14 MED ORDER — IBUPROFEN 400 MG PO TABS
400.0000 mg | ORAL_TABLET | Freq: Once | ORAL | Status: AC | PRN
  Administered 2020-03-14: 400 mg via ORAL
  Filled 2020-03-14: qty 1
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
## 2020-03-15 ENCOUNTER — Emergency Department (HOSPITAL_BASED_OUTPATIENT_CLINIC_OR_DEPARTMENT_OTHER)
Admission: EM | Admit: 2020-03-15 | Discharge: 2020-03-15 | Disposition: A | Discharge status: Home / Self Care | Payer: Self-pay | Attending: Emergency Medicine | Admitting: Emergency Medicine

## 2020-03-15 DIAGNOSIS — S62346A Nondisplaced fracture of base of fifth metacarpal bone, right hand, initial encounter for closed fracture: Secondary | ICD-10-CM

## 2020-03-15 MED ORDER — OXYCODONE-ACETAMINOPHEN 5-325 MG PO TABS: 1.0000 | ORAL_TABLET | Freq: Four times a day (QID) | ORAL | 0 refills | Status: DC | PRN

## 2020-03-15 MED ORDER — OXYCODONE-ACETAMINOPHEN 5-325 MG PO TABS
1.0000 | ORAL_TABLET | Freq: Once | ORAL | Status: AC
  Administered 2020-03-15: 1 via ORAL
  Filled 2020-03-15: qty 1
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
## 2020-04-02 ENCOUNTER — Encounter (HOSPITAL_COMMUNITY): Payer: Self-pay | Admitting: Family Medicine

## 2020-04-02 ENCOUNTER — Inpatient Hospital Stay (HOSPITAL_COMMUNITY)
Admission: AD | Admit: 2020-04-02 | Discharge: 2020-04-02 | Disposition: A | Discharge status: Home / Self Care | Payer: Self-pay | Attending: Family Medicine | Admitting: Family Medicine

## 2020-04-02 ENCOUNTER — Inpatient Hospital Stay (HOSPITAL_COMMUNITY): Payer: Self-pay

## 2020-04-02 ENCOUNTER — Other Ambulatory Visit: Payer: Self-pay

## 2020-04-02 DIAGNOSIS — N730 Acute parametritis and pelvic cellulitis: Secondary | ICD-10-CM

## 2020-04-02 DIAGNOSIS — R102 Pelvic and perineal pain: Secondary | ICD-10-CM

## 2020-04-02 DIAGNOSIS — R109 Unspecified abdominal pain: Secondary | ICD-10-CM | POA: Insufficient documentation

## 2020-04-02 DIAGNOSIS — M549 Dorsalgia, unspecified: Secondary | ICD-10-CM | POA: Insufficient documentation

## 2020-04-02 LAB — URINALYSIS, ROUTINE W REFLEX MICROSCOPIC
Bacteria, UA: NONE SEEN
Bilirubin Urine: NEGATIVE
Glucose, UA: NEGATIVE mg/dL
Hgb urine dipstick: NEGATIVE
Ketones, ur: 20 mg/dL — AB
Nitrite: NEGATIVE
Protein, ur: NEGATIVE mg/dL
Specific Gravity, Urine: 1.013 (ref 1.005–1.030)
pH: 7 (ref 5.0–8.0)

## 2020-04-02 LAB — CBC WITH DIFFERENTIAL/PLATELET
Abs Immature Granulocytes: 0.01 10*3/uL (ref 0.00–0.07)
Basophils Absolute: 0 10*3/uL (ref 0.0–0.1)
Basophils Relative: 0 %
Eosinophils Absolute: 0.1 10*3/uL (ref 0.0–0.5)
Eosinophils Relative: 1 %
HCT: 33 % — ABNORMAL LOW (ref 36.0–46.0)
Hemoglobin: 10.6 g/dL — ABNORMAL LOW (ref 12.0–15.0)
Immature Granulocytes: 0 %
Lymphocytes Relative: 37 %
Lymphs Abs: 2.1 10*3/uL (ref 0.7–4.0)
MCH: 23.9 pg — ABNORMAL LOW (ref 26.0–34.0)
MCHC: 32.1 g/dL (ref 30.0–36.0)
MCV: 74.5 fL — ABNORMAL LOW (ref 80.0–100.0)
Monocytes Absolute: 0.4 10*3/uL (ref 0.1–1.0)
Monocytes Relative: 7 %
Neutro Abs: 3.1 10*3/uL (ref 1.7–7.7)
Neutrophils Relative %: 55 %
Platelets: 297 10*3/uL (ref 150–400)
RBC: 4.43 MIL/uL (ref 3.87–5.11)
RDW: 13.4 % (ref 11.5–15.5)
WBC: 5.6 10*3/uL (ref 4.0–10.5)
nRBC: 0 % (ref 0.0–0.2)

## 2020-04-02 LAB — WET PREP, GENITAL
Clue Cells Wet Prep HPF POC: NONE SEEN
Sperm: NONE SEEN
Trich, Wet Prep: NONE SEEN
Yeast Wet Prep HPF POC: NONE SEEN

## 2020-04-02 MED ORDER — METRONIDAZOLE 500 MG PO TABS: 500.0000 mg | ORAL_TABLET | Freq: Two times a day (BID) | ORAL | 0 refills | Status: AC

## 2020-04-02 MED ORDER — CEFTRIAXONE SODIUM 500 MG IJ SOLR
500.0000 mg | Freq: Once | INTRAMUSCULAR | Status: AC
  Administered 2020-04-02: 500 mg via INTRAMUSCULAR
  Filled 2020-04-02: qty 500

## 2020-04-02 MED ORDER — HYDROMORPHONE HCL 1 MG/ML IJ SOLN
1.0000 mg | Freq: Once | INTRAMUSCULAR | Status: AC
  Administered 2020-04-02: 1 mg via INTRAMUSCULAR
  Filled 2020-04-02: qty 1

## 2020-04-02 MED ORDER — LIDOCAINE HCL (PF) 1 % IJ SOLN
1.0000 mL | Freq: Once | INTRAMUSCULAR | Status: AC
  Administered 2020-04-02: 1 mL
  Filled 2020-04-02: qty 5

## 2020-04-02 NOTE — Discharge Instructions (Signed)
Pelvic Inflammatory Disease  Pelvic inflammatory disease (PID) is caused by an infection in some or all of the female reproductive organs. The infection can be in the uterus, ovaries, fallopian tubes, or the surrounding tissues in the pelvis. PID can cause abdominal or pelvic pain that comes on suddenly (acute pelvic pain). PID is a serious infection because it can lead to lasting (chronic) pelvic pain or the inability to have children (infertility). What are the causes? This condition is most often caused by bacteria that is spread during sexual contact. It can also be caused by a bacterial infection of the vagina (bacterial vaginosis) that is not spread by sexual contact. This condition occurs when the infection is not treated and the bacteria travel upward from the vagina or cervix into the reproductive organs. Bacteria may also be introduced into the reproductive organs following:  The birth of a baby.  A miscarriage.  An abortion.  Major pelvic surgery.  The insertion of an intrauterine device (IUD).  A sexual assault. What increases the risk? You are more likely to develop this condition if you:  Are younger than 30 years of age.  Are sexually active at a young age.  Have a history of STI (sexually transmitted infection) or PID.  Do not regularly use barrier contraception methods, such as condoms.  Have multiple sexual partners.  Have sex with someone who has symptoms of an STI.  Use a vaginal douche.  Have recently had an IUD inserted. What are the signs or symptoms? Symptoms of this condition include:  Abdominal or pelvic pain.  Fever.  Chills.  Abnormal vaginal discharge.  Abnormal uterine bleeding.  Unusual pain shortly after the end of a menstrual period.  Painful urination.  Pain with sex.  Nausea and vomiting. How is this diagnosed? This condition is diagnosed based on a pelvic exam and medical history. A pelvic exam can reveal signs of  infection, inflammation, and discharge in the vagina and the surrounding tissues. It can also help to identify painful areas. You may also have tests, such as:  Lab tests, including a pregnancy test, blood tests, and a urine test.  Culture tests of the vagina and cervix to check for an STI.  Ultrasound.  A laparoscopic procedure to look inside the pelvis.  Examination of vaginal discharge under a microscope. How is this treated? This condition may be treated with:  Antibiotic medicines taken by mouth (orally). For more severe cases, antibiotics may be given through an IV at the hospital.  Surgery. This is rare. Surgery may be needed if other treatments do not help.  Efforts to stop the spread of the infection. Sexual partners may need to be treated if the infection is caused by an STI. It may take weeks until you are completely well. If you are diagnosed with PID, you should also be checked for HIV (human immunodeficiency virus). Your health care provider may test you for infection again 3 months after treatment. You should not have unprotected sex. Follow these instructions at home:  Take over-the-counter and prescription medicines only as told by your health care provider.  If you were prescribed an antibiotic medicine, take it as told by your health care provider. Do not stop using the antibiotic even if you start to feel better.  Do not have sex until treatment is completed or as told by your health care provider. If PID is confirmed, your recent sexual partners will need treatment, especially if you had unprotected sex.  Keep all   follow-up visits as told by your health care provider. This is important. Contact a health care provider if:  You have increased or abnormal vaginal discharge.  Your pain does not improve.  You vomit.  You have a fever.  You cannot tolerate your medicines.  Your partner has an STI.  You have pain when you urinate. Get help right away if:   You have increased abdominal or pelvic pain.  You have chills.  Your symptoms are not better in 72 hours with treatment. Summary  Pelvic inflammatory disease (PID) is caused by an infection in some or all of the female reproductive organs.  PID is a serious infection because it can lead to lasting (chronic) pelvic pain or the inability to have children (infertility).  This infection is usually treated with antibiotic medicines.  Do not have sex until treatment is completed or as told by your health care provider. This information is not intended to replace advice given to you by your health care provider. Make sure you discuss any questions you have with your health care provider. Document Revised: 02/20/2018 Document Reviewed: 02/25/2018 Elsevier Patient Education  2020 [employer]  

## 2020-04-02 NOTE — MAU Note (Signed)
Presents via EMS with c/o lower abdominal and back pain.  Reports pain began 2 hours ago and is sharp shooting stabbing from her vagina to her bellybutton.  Also reports pain in mid back down to her tailbone.  States pain began immediately after intercourse.  Denies VB.  Reports currently has IUD in place and it's been in for years, states it's almost time to be removed.

## 2020-04-02 NOTE — MAU Provider Note (Signed)
History     CSN: [id number]  Arrival date and time: 04/02/20 1649   First Provider Initiated Contact with Patient 04/02/20 1735      Chief Complaint  Patient presents with  . Abdominal Pain  . Back Pain   Olivia Morton is a 29 y.o. G1P1 non pregnant female who presents via EMS for pelvic pain. Reports sudden onset of severe pelvic/abdominal/back pain that started immediately after intercourse today. She has had intercourse with this partner before, no piercings, no toys used. Did not use lubricant. Has mirena that's been in place for 4 years. Denies fever/chills, n/v, vaginal discharge, or vaginal bleeding. States pain is in lower abdomen/pelvis, radiates to her vagina & back, and is worse when she tried to straighten out.   Location: pelvis, vagina, back Quality: sharp, stabbing Severity: 10/10 on pain scale Duration: 3 hours Timing: constant Modifying factors: worse with movement Associated signs and symptoms: none      Past Medical History:  Diagnosis Date  . PID (acute pelvic inflammatory disease) 2020    Past Surgical History:  Procedure Laterality Date  . BREAST SURGERY     Abscess to left breast  . CHOLECYSTECTOMY      Family History  Problem Relation Age of Onset  . Hypertension Mother   . Hypertension Sister     Social History   Tobacco Use  . Smoking status: Never Smoker  . Smokeless tobacco: Never Used  Vaping Use  . Vaping Use: Never used  Substance Use Topics  . Alcohol use: Yes    Comment: Ocassionally  . Drug use: Yes    Types: Marijuana    Comment: last smoked October 2021    Allergies:  Allergies  Allergen Reactions  . Grapeseed Extract [Nutritional Supplements] Rash    Nauseous    Medications Prior to Admission  Medication Sig Dispense Refill Last Dose  . amoxicillin-clavulanate (AUGMENTIN) 500-125 MG tablet Take 1 tablet by mouth 3 (three) times daily.   04/02/2020 at 1200  . ibuprofen (ADVIL) 800 MG tablet Take 800 mg by mouth  every 8 (eight) hours as needed for moderate pain (dental procedure).   04/02/2020 at Unknown time  . oxyCODONE-acetaminophen (PERCOCET) 5-325 MG tablet Take 1 tablet by mouth every 6 (six) hours as needed for severe pain. 20 tablet 0     Review of Systems  Constitutional: Negative.   Gastrointestinal: Negative.   Genitourinary: Positive for pelvic pain and vaginal pain. Negative for dyspareunia, dysuria, flank pain, genital sores, vaginal bleeding and vaginal discharge.  Musculoskeletal: Positive for back pain.   Physical Exam   Blood pressure (!) 119/95, pulse 72, temperature 97.9 F (36.6 C), temperature source Oral, resp. rate 18, last menstrual period 03/03/2020, SpO2 100 %.  Physical Exam Vitals and nursing note reviewed. Exam conducted with a chaperone present.  Constitutional:      General: She is in acute distress (patient in room yelling due to pain).  HENT:     Head: Normocephalic and atraumatic.  Pulmonary:     Effort: Pulmonary effort is normal. No respiratory distress.  Abdominal:     General: Abdomen is flat.     Palpations: Abdomen is soft.     Tenderness: There is abdominal tenderness in the suprapubic area. There is no guarding or rebound.  Genitourinary:    General: Normal vulva.     Exam position: Lithotomy position.     Vagina: Normal.     Cervix: Cervical motion tenderness and discharge present. No friability,  erythema or cervical bleeding.     Uterus: Normal.      Adnexa: Right adnexa normal and left adnexa normal.     Comments: IUD strings visualized    MAU Course  Procedures Results for orders placed or performed during the hospital encounter of 04/02/20 (from the past 24 hour(s))  CBC with Differential/Platelet     Status: Abnormal   Collection Time: 04/02/20  5:46 PM  Result Value Ref Range   WBC 5.6 4.0 - 10.5 K/uL   RBC 4.43 3.87 - 5.11 MIL/uL   Hemoglobin 10.6 (L) 12.0 - 15.0 g/dL   HCT 71.1 (L) 36 - 46 %   MCV 74.5 (L) 80.0 - 100.0 fL    MCH 23.9 (L) 26.0 - 34.0 pg   MCHC 32.1 30.0 - 36.0 g/dL   RDW 65.7 90.3 - 83.3 %   Platelets 297 150 - 400 K/uL   nRBC 0.0 0.0 - 0.2 %   Neutrophils Relative % 55 %   Neutro Abs 3.1 1.7 - 7.7 K/uL   Lymphocytes Relative 37 %   Lymphs Abs 2.1 0.7 - 4.0 K/uL   Monocytes Relative 7 %   Monocytes Absolute 0.4 0.1 - 1.0 K/uL   Eosinophils Relative 1 %   Eosinophils Absolute 0.1 0.0 - 0.5 K/uL   Basophils Relative 0 %   Basophils Absolute 0.0 0.0 - 0.1 K/uL   Immature Granulocytes 0 %   Abs Immature Granulocytes 0.01 0.00 - 0.07 K/uL  Urinalysis, Routine w reflex microscopic Urine, Clean Catch     Status: Abnormal   Collection Time: 04/02/20  5:50 PM  Result Value Ref Range   Color, Urine STRAW (A) YELLOW   APPearance CLEAR CLEAR   Specific Gravity, Urine 1.013 1.005 - 1.030   pH 7.0 5.0 - 8.0   Glucose, UA NEGATIVE NEGATIVE mg/dL   Hgb urine dipstick NEGATIVE NEGATIVE   Bilirubin Urine NEGATIVE NEGATIVE   Ketones, ur 20 (A) NEGATIVE mg/dL   Protein, ur NEGATIVE NEGATIVE mg/dL   Nitrite NEGATIVE NEGATIVE   Leukocytes,Ua TRACE (A) NEGATIVE   RBC / HPF 0-5 0 - 5 RBC/hpf   WBC, UA 0-5 0 - 5 WBC/hpf   Bacteria, UA NONE SEEN NONE SEEN   Squamous Epithelial / LPF 0-5 0 - 5   Mucus PRESENT   Wet prep, genital     Status: Abnormal   Collection Time: 04/02/20  6:00 PM   Specimen: PATH Cytology Cervicovaginal Ancillary Only  Result Value Ref Range   Yeast Wet Prep HPF POC NONE SEEN NONE SEEN   Trich, Wet Prep NONE SEEN NONE SEEN   Clue Cells Wet Prep HPF POC NONE SEEN NONE SEEN   WBC, Wet Prep HPF POC FEW (A) NONE SEEN   Sperm NONE SEEN    US PELVIC COMPLETE W TRANSVAGINAL AND TORSION R/O  Result Date: 04/02/2020 CLINICAL DATA:  Pelvic pain EXAM: TRANSABDOMINAL AND TRANSVAGINAL ULTRASOUND OF PELVIS DOPPLER ULTRASOUND OF OVARIES TECHNIQUE: Both transabdominal and transvaginal ultrasound examinations of the pelvis were performed. Transabdominal technique was performed for global  imaging of the pelvis including uterus, ovaries, adnexal regions, and pelvic cul-de-sac. It was necessary to proceed with endovaginal exam following the transabdominal exam to visualize the uterus, endometrium, ovaries and adnexa. Color and duplex Doppler ultrasound was utilized to evaluate blood flow to the ovaries. COMPARISON:  None. FINDINGS: Uterus Measurements: 9.0 x 4.2 x 5.4 cm = volume: 106 mL. No fibroids or other mass visualized. Endometrium Thickness: Difficult to  measure due to IUD in place. Endometrium is thin. No focal abnormality visualized. Right ovary Measurements: 2.6 x 2.3 x 3.4 cm = volume: 10.5 mL. Normal appearance/no adnexal mass. Left ovary Measurements: 4.6 x 3.0 x 3.7 cm = volume: 27 mL. Normal appearance/no adnexal mass. Pulsed Doppler evaluation of both ovaries demonstrates normal low-resistance arterial and venous waveforms. Other findings No abnormal free fluid. IMPRESSION: Normal study.  IUD in place. Electronically Signed   By: Charlett Nose M.D.   On: 04/02/2020 18:57    MDM Patient present via EMS for sudden onset pelvic pain immediately after intercourse. Yelling and crying on stretcher and while in MAU room. IM dilaudid ordered so assessment/exam could be completed.   UPT negative  Pelvic exam performed. Patient TTP in suprapubic area & has significant cervical motion tenderness. Uterus is normal in size & is not tender. No adnexal masses palpated. IUD strings visualized. Patient with history of PID x 2. Will tx for PID. Rocephin 500 mg IM given in MAU. Currently taking augmentin for dental infection; discussed with Dr. Shawnie Pons, will rx flagyl x 14 days while cultures pending & defer doxycycline.   Pelvic ultrasound performed. IUD is in place. No adnexal or ovarian masses and no evidence of ovarian torsion.   Patient requesting IUD to be removed. Due to patient's pain, proceeded with removal. Patient declines any other contraception at this time.   Pain improved with  pain medication & after removal of IUD. Ready for discharge home.   Assessment and Plan   1. Acute pelvic inflammatory disease  -Rocephin given in MAU. Prescribed flagyl.  -No intercourse x 2 weeks -GC/CT pending  2. Acute pelvic pain, female      Judeth Horn 04/02/2020, 5:35 PM

## 2020-04-04 LAB — GC/CHLAMYDIA PROBE AMP (~~LOC~~) NOT AT ARMC
Chlamydia: NEGATIVE
Comment: NEGATIVE
Comment: NORMAL
Neisseria Gonorrhea: NEGATIVE

## 2021-10-29 ENCOUNTER — Encounter (HOSPITAL_BASED_OUTPATIENT_CLINIC_OR_DEPARTMENT_OTHER): Payer: Self-pay | Admitting: Emergency Medicine

## 2021-10-29 ENCOUNTER — Emergency Department (HOSPITAL_BASED_OUTPATIENT_CLINIC_OR_DEPARTMENT_OTHER)
Admission: EM | Admit: 2021-10-29 | Discharge: 2021-10-29 | Disposition: A | Discharge status: Home / Self Care | Payer: Self-pay | Attending: Emergency Medicine | Admitting: Emergency Medicine

## 2021-10-29 ENCOUNTER — Other Ambulatory Visit: Payer: Self-pay

## 2021-10-29 DIAGNOSIS — J020 Streptococcal pharyngitis: Secondary | ICD-10-CM | POA: Insufficient documentation

## 2021-10-29 LAB — GROUP A STREP BY PCR: Group A Strep by PCR: NOT DETECTED

## 2021-10-29 MED ORDER — CLINDAMYCIN HCL 150 MG PO CAPS
300.0000 mg | ORAL_CAPSULE | Freq: Once | ORAL | Status: AC
  Administered 2021-10-29: 300 mg via ORAL
  Filled 2021-10-29: qty 2

## 2021-10-29 MED ORDER — DEXAMETHASONE 4 MG PO TABS
10.0000 mg | ORAL_TABLET | Freq: Once | ORAL | Status: AC
  Administered 2021-10-29: 10 mg via ORAL
  Filled 2021-10-29: qty 3

## 2021-10-29 MED ORDER — CLINDAMYCIN HCL 300 MG PO CAPS: 300.0000 mg | ORAL_CAPSULE | Freq: Three times a day (TID) | ORAL | 0 refills | Status: DC

## 2021-10-29 NOTE — ED Provider Notes (Signed)
?[facility] EMERGENCY DEPARTMENT ?Provider Note ? ? ?CSN: [id number] ?Arrival date & time: 10/29/21  0549 ? ?  ? ?History ? ?Chief Complaint  ?Patient presents with  ? Sore Throat  ? ? ?Olivia Morton is a 31 y.o. female. ? ?The history is provided by the patient.  ?Sore Throat ?This is a new problem. The current episode started 2 days ago. The problem occurs daily. The problem has not changed since onset.Pertinent negatives include no chest pain, no abdominal pain, no headaches and no shortness of breath. Nothing aggravates the symptoms. Nothing relieves the symptoms. She has tried nothing for the symptoms. The treatment provided no relief.  ? ?  ? ?Home Medications ?Prior to Admission medications   ?Medication Sig Start Date End Date Taking? Authorizing Provider  ?clindamycin (CLEOCIN) 300 MG capsule Take 1 capsule (300 mg total) by mouth 3 (three) times daily for 10 days. 10/29/21 11/08/21 Yes Takyah Ciaramitaro, DO  ?ibuprofen (ADVIL) 800 MG tablet Take 800 mg by mouth every 8 (eight) hours as needed for moderate pain (dental procedure).    [provider]  ?   ? ?Allergies    ?Grapeseed extract [nutritional supplements]   ? ?Review of Systems   ?Review of Systems  ?Respiratory:  Negative for shortness of breath.   ?Cardiovascular:  Negative for chest pain.  ?Gastrointestinal:  Negative for abdominal pain.  ?Neurological:  Negative for headaches.  ? ?Physical Exam ?Updated Vital Signs ?BP 110/85 (BP Location: Right Arm)   Pulse 78   Temp 98.2 ?F (36.8 ?C) (Oral)   Resp 18   Ht 5\' 8"  (1.727 m)   Wt 77.1 kg   LMP 10/08/2021   SpO2 100%   BMI 25.85 kg/m?  ?Physical Exam ?HENT:  ?   Head: Normocephalic.  ?   Right Ear: Tympanic membrane normal.  ?   Left Ear: Tympanic membrane normal.  ?   Mouth/Throat:  ?   Mouth: Mucous membranes are moist.  ?   Pharynx: Uvula midline. Posterior oropharyngeal erythema present. No pharyngeal swelling or oropharyngeal exudate.  ?   Tonsils: No tonsillar exudate.   ?Eyes:  ?   Conjunctiva/sclera: Conjunctivae normal.  ?Cardiovascular:  ?   Rate and Rhythm: Normal rate.  ?   Heart sounds: Normal heart sounds.  ?Musculoskeletal:  ?   Cervical back: Normal range of motion.  ?Lymphadenopathy:  ?   Cervical: No cervical adenopathy.  ?Skin: ?   General: Skin is warm.  ?Neurological:  ?   Mental Status: She is alert.  ? ?ED Results / Procedures / Treatments   ?Labs ?(all labs ordered are listed, but only abnormal results are displayed) ?Labs Reviewed  ?GROUP A STREP BY PCR  ? ? ?EKG ?None ? ?Radiology ?No results found. ? ?Procedures ?Procedures  ? ? ?Medications Ordered in ED ?Medications  ?dexamethasone (DECADRON) tablet 10 mg (has no administration in time range)  ?clindamycin (CLEOCIN) capsule 300 mg (has no administration in time range)  ? ? ?ED Course/ Medical Decision Making/ A&P ?  ?                        ?Medical Decision Making ?Risk ?Prescription drug management. ? ? ?Olivia Morton is here with sore throat.  Strep test negative.  Normal vitals.  No fever.  No concern for deep space infection.  Some erythema in the posterior oropharynx.  Fairly mild.  Normal phonation.  No trismus, no drooling.  Overall suspect  viral versus allergic versus atypical organism.  Will treat with Decadron and clindamycin.  Given return precautions and discharged from the ED condition. ? ?This chart was dictated using voice recognition software.  Despite best efforts to proofread,  errors can occur which can change the documentation meaning.  ? ? ? ? ? ? ? ?Final Clinical Impression(s) / ED Diagnoses ?Final diagnoses:  ?Strep throat  ? ? ?Rx / DC Orders ?ED Discharge Orders   ? ?      Ordered  ?  clindamycin (CLEOCIN) 300 MG capsule  3 times daily       ? 10/29/21 0715  ? ?  ?  ? ?  ? ? ?  ?Virgina Norfolk, DO ?10/29/21 2353 ? ?

## 2021-10-29 NOTE — ED Triage Notes (Signed)
Pt states R sided sore throat x 2 days. Pt is able to speak clearly and manage her own secretions. No sick contacts that she's aware of. Denies fever.  ?

## 2021-10-29 NOTE — Discharge Instructions (Signed)
Take next dose antibiotic around 3:00.  You have been treated with a long-acting steroid.  Suspect that this is allergy or atypical organism related. ?

## 2021-10-29 NOTE — ED Notes (Signed)
Denies any difficulty with swallowing or with speech, tracheal sounds clear. GCS 15 ?

## 2021-10-31 ENCOUNTER — Inpatient Hospital Stay (HOSPITAL_COMMUNITY)
Admission: EM | Admit: 2021-10-31 | Discharge: 2021-11-03 | DRG: 392 | Disposition: A | Discharge status: Home / Self Care | Payer: Self-pay | Attending: Internal Medicine | Admitting: Internal Medicine

## 2021-10-31 ENCOUNTER — Encounter (HOSPITAL_COMMUNITY): Payer: Self-pay | Admitting: Emergency Medicine

## 2021-10-31 ENCOUNTER — Other Ambulatory Visit: Payer: Self-pay

## 2021-10-31 DIAGNOSIS — R112 Nausea with vomiting, unspecified: Secondary | ICD-10-CM | POA: Diagnosis present

## 2021-10-31 DIAGNOSIS — Z881 Allergy status to other antibiotic agents status: Secondary | ICD-10-CM

## 2021-10-31 DIAGNOSIS — J020 Streptococcal pharyngitis: Secondary | ICD-10-CM | POA: Diagnosis present

## 2021-10-31 DIAGNOSIS — Z9102 Food additives allergy status: Secondary | ICD-10-CM

## 2021-10-31 DIAGNOSIS — R609 Edema, unspecified: Secondary | ICD-10-CM | POA: Diagnosis present

## 2021-10-31 DIAGNOSIS — R350 Frequency of micturition: Secondary | ICD-10-CM | POA: Diagnosis present

## 2021-10-31 DIAGNOSIS — R1011 Right upper quadrant pain: Principal | ICD-10-CM | POA: Diagnosis present

## 2021-10-31 DIAGNOSIS — R197 Diarrhea, unspecified: Secondary | ICD-10-CM | POA: Diagnosis present

## 2021-10-31 DIAGNOSIS — J029 Acute pharyngitis, unspecified: Secondary | ICD-10-CM | POA: Diagnosis present

## 2021-10-31 DIAGNOSIS — Z6825 Body mass index (BMI) 25.0-25.9, adult: Secondary | ICD-10-CM

## 2021-10-31 DIAGNOSIS — R7401 Elevation of levels of liver transaminase levels: Secondary | ICD-10-CM | POA: Diagnosis present

## 2021-10-31 DIAGNOSIS — E876 Hypokalemia: Secondary | ICD-10-CM | POA: Diagnosis present

## 2021-10-31 DIAGNOSIS — R109 Unspecified abdominal pain: Secondary | ICD-10-CM | POA: Diagnosis present

## 2021-10-31 DIAGNOSIS — Z9049 Acquired absence of other specified parts of digestive tract: Secondary | ICD-10-CM

## 2021-10-31 DIAGNOSIS — R1084 Generalized abdominal pain: Principal | ICD-10-CM

## 2021-10-31 DIAGNOSIS — Z20822 Contact with and (suspected) exposure to covid-19: Secondary | ICD-10-CM | POA: Diagnosis present

## 2021-10-31 DIAGNOSIS — R635 Abnormal weight gain: Secondary | ICD-10-CM | POA: Diagnosis present

## 2021-10-31 DIAGNOSIS — D509 Iron deficiency anemia, unspecified: Secondary | ICD-10-CM | POA: Diagnosis present

## 2021-10-31 DIAGNOSIS — N920 Excessive and frequent menstruation with regular cycle: Secondary | ICD-10-CM | POA: Diagnosis present

## 2021-10-31 LAB — RESP PANEL BY RT-PCR (FLU A&B, COVID) ARPGX2
Influenza A by PCR: NEGATIVE
Influenza B by PCR: NEGATIVE
SARS Coronavirus 2 by RT PCR: NEGATIVE

## 2021-10-31 LAB — GROUP A STREP BY PCR: Group A Strep by PCR: NOT DETECTED

## 2021-10-31 NOTE — ED Triage Notes (Signed)
Patient states she went to [facility] the other night for sore throat. Was given clindamycin and took one dose that she reports shut her body down and made her lose control of her bowels. Was taken to [facility] and evaluated but not given any prescriptions.  ?

## 2021-10-31 NOTE — ED Provider Triage Note (Signed)
Emergency Medicine Provider Triage Evaluation Note ? ?Olivia Morton , a 31 y.o. female  was evaluated in triage.  Pt complains of continuing throat pain.  Was seen in the ED for throat pain and tenderness and painful swallowing.  Was started on clindamycin.  Had nausea, vomiting, diarrhea after first dose of clindamycin.  Stop the clindamycin.  Went to a different ED for her new symptoms, was told that her liver was mildly enlarged and that she had a "infection" and then was discharged.  States her throat is still sore.  Denies fever, headache, vision changes, chest pain, shortness of breath, neck stiffness.  Has been taking ibuprofen and Tylenol over the last 24 hours.  Complaining of a possible lump on the right side of her neck. ? ?Review of Systems  ?Positive: As above ?Negative: As above ? ?Physical Exam  ?BP 116/66 (BP Location: Right Arm)   Pulse 83   Temp 99.1 ?F (37.3 ?C) (Oral)   Resp 16   Ht 5\' 8"  (1.727 m)   Wt 77.1 kg   LMP 10/08/2021   SpO2 100%   BMI 25.85 kg/m?  ?Gen:   Awake, no distress, tearful ?Resp:  Normal effort, CTAB ?MSK:   Moves extremities without difficulty  ?Other:  Abdomen soft nontender.  Mildly enlarged and erythematous tonsils about 2+.  Able to swallow with mild subjective pain.  Afebrile, nontachycardic, 100% SPO2 on room air.  ABCs intact.  Cervical lymphadenopathy appreciated, larger on the right side. ? ?Medical Decision Making  ?Medically screening exam initiated at 7:38 PM.  Appropriate orders placed.  Gianni Fuchs was informed that the remainder of the evaluation will be completed by another provider, this initial triage assessment does not replace that evaluation, and the importance of remaining in the ED until their evaluation is complete. ? ?Labs ordered. ?  ?Olivia Leitz, PA-C ?10/31/21 1952 ? ?

## 2021-11-01 ENCOUNTER — Emergency Department (HOSPITAL_COMMUNITY): Payer: Self-pay

## 2021-11-01 DIAGNOSIS — R109 Unspecified abdominal pain: Secondary | ICD-10-CM | POA: Diagnosis present

## 2021-11-01 DIAGNOSIS — R635 Abnormal weight gain: Secondary | ICD-10-CM

## 2021-11-01 DIAGNOSIS — R197 Diarrhea, unspecified: Secondary | ICD-10-CM

## 2021-11-01 DIAGNOSIS — R350 Frequency of micturition: Secondary | ICD-10-CM | POA: Diagnosis present

## 2021-11-01 DIAGNOSIS — J029 Acute pharyngitis, unspecified: Secondary | ICD-10-CM

## 2021-11-01 DIAGNOSIS — R7401 Elevation of levels of liver transaminase levels: Secondary | ICD-10-CM

## 2021-11-01 DIAGNOSIS — E876 Hypokalemia: Secondary | ICD-10-CM

## 2021-11-01 DIAGNOSIS — D509 Iron deficiency anemia, unspecified: Secondary | ICD-10-CM

## 2021-11-01 DIAGNOSIS — R112 Nausea with vomiting, unspecified: Secondary | ICD-10-CM | POA: Diagnosis present

## 2021-11-01 DIAGNOSIS — R748 Abnormal levels of other serum enzymes: Secondary | ICD-10-CM

## 2021-11-01 LAB — CBC WITH DIFFERENTIAL/PLATELET
Abs Immature Granulocytes: 0.02 10*3/uL (ref 0.00–0.07)
Basophils Absolute: 0 10*3/uL (ref 0.0–0.1)
Basophils Relative: 0 %
Eosinophils Absolute: 0.1 10*3/uL (ref 0.0–0.5)
Eosinophils Relative: 2 %
HCT: 35.4 % — ABNORMAL LOW (ref 36.0–46.0)
Hemoglobin: 11.4 g/dL — ABNORMAL LOW (ref 12.0–15.0)
Immature Granulocytes: 0 %
Lymphocytes Relative: 34 %
Lymphs Abs: 2 10*3/uL (ref 0.7–4.0)
MCH: 24.2 pg — ABNORMAL LOW (ref 26.0–34.0)
MCHC: 32.2 g/dL (ref 30.0–36.0)
MCV: 75.2 fL — ABNORMAL LOW (ref 80.0–100.0)
Monocytes Absolute: 0.5 10*3/uL (ref 0.1–1.0)
Monocytes Relative: 8 %
Neutro Abs: 3.2 10*3/uL (ref 1.7–7.7)
Neutrophils Relative %: 56 %
Platelets: 247 10*3/uL (ref 150–400)
RBC: 4.71 MIL/uL (ref 3.87–5.11)
RDW: 13.9 % (ref 11.5–15.5)
WBC: 5.8 10*3/uL (ref 4.0–10.5)
nRBC: 0 % (ref 0.0–0.2)

## 2021-11-01 LAB — URINALYSIS, ROUTINE W REFLEX MICROSCOPIC
Bilirubin Urine: NEGATIVE
Glucose, UA: NEGATIVE mg/dL
Hgb urine dipstick: NEGATIVE
Ketones, ur: 5 mg/dL — AB
Leukocytes,Ua: NEGATIVE
Nitrite: NEGATIVE
Protein, ur: NEGATIVE mg/dL
Specific Gravity, Urine: 1.01 (ref 1.005–1.030)
pH: 5 (ref 5.0–8.0)

## 2021-11-01 LAB — TSH: TSH: 4.495 u[IU]/mL (ref 0.350–4.500)

## 2021-11-01 LAB — RESPIRATORY PANEL BY PCR

## 2021-11-01 LAB — HIV ANTIBODY (ROUTINE TESTING W REFLEX): HIV Screen 4th Generation wRfx: NONREACTIVE

## 2021-11-01 LAB — COMPREHENSIVE METABOLIC PANEL
ALT: 583 U/L — ABNORMAL HIGH (ref 0–44)
AST: 316 U/L — ABNORMAL HIGH (ref 15–41)
Albumin: 3.7 g/dL (ref 3.5–5.0)
Alkaline Phosphatase: 106 U/L (ref 38–126)
Anion gap: 7 (ref 5–15)
BUN: 8 mg/dL (ref 6–20)
CO2: 23 mmol/L (ref 22–32)
Calcium: 9.1 mg/dL (ref 8.9–10.3)
Chloride: 106 mmol/L (ref 98–111)
Creatinine, Ser: 0.79 mg/dL (ref 0.44–1.00)
GFR, Estimated: >60 mL/min (ref >60)
Glucose, Bld: 90 mg/dL (ref 70–99)
Potassium: 3.4 mmol/L — ABNORMAL LOW (ref 3.5–5.1)
Sodium: 136 mmol/L (ref 135–145)
Total Bilirubin: 0.6 mg/dL (ref 0.3–1.2)
Total Protein: 6.9 g/dL (ref 6.5–8.1)

## 2021-11-01 LAB — RAPID URINE DRUG SCREEN, HOSP PERFORMED
Amphetamines: NOT DETECTED
Barbiturates: NOT DETECTED
Benzodiazepines: NOT DETECTED
Cocaine: NOT DETECTED
Opiates: POSITIVE — AB
Tetrahydrocannabinol: POSITIVE — AB

## 2021-11-01 LAB — RETICULOCYTES
Immature Retic Fract: 11.3 % (ref 2.3–15.9)
RBC.: 5.01 MIL/uL (ref 3.87–5.11)
Retic Count, Absolute: 61.1 10*3/uL (ref 19.0–186.0)
Retic Ct Pct: 1.2 % (ref 0.4–3.1)

## 2021-11-01 LAB — ACETAMINOPHEN LEVEL: Acetaminophen (Tylenol), Serum: <10 ug/mL — ABNORMAL LOW (ref 10–30)

## 2021-11-01 LAB — PROTIME-INR
INR: 1.1 (ref 0.8–1.2)
Prothrombin Time: 13.6 seconds (ref 11.4–15.2)

## 2021-11-01 LAB — IRON AND TIBC
Iron: 68 ug/dL (ref 28–170)
Saturation Ratios: 17 % (ref 10.4–31.8)
TIBC: 405 ug/dL (ref 250–450)
UIBC: 337 ug/dL

## 2021-11-01 LAB — FERRITIN: Ferritin: 171 ng/mL (ref 11–307)

## 2021-11-01 LAB — GAMMA GT: GGT: 137 U/L — ABNORMAL HIGH (ref 7–50)

## 2021-11-01 LAB — LIPASE, BLOOD: Lipase: 29 U/L (ref 11–51)

## 2021-11-01 MED ORDER — ONDANSETRON HCL 4 MG/2ML IJ SOLN
4.0000 mg | Freq: Once | INTRAMUSCULAR | Status: AC
  Administered 2021-11-01: 4 mg via INTRAVENOUS
  Filled 2021-11-01: qty 2

## 2021-11-01 MED ORDER — MORPHINE SULFATE (PF) 4 MG/ML IV SOLN
4.0000 mg | Freq: Once | INTRAVENOUS | Status: AC
  Administered 2021-11-01: 4 mg via INTRAVENOUS
  Filled 2021-11-01: qty 1

## 2021-11-01 MED ORDER — HYDRALAZINE HCL 20 MG/ML IJ SOLN: 10.0000 mg | INTRAMUSCULAR | Status: DC | PRN

## 2021-11-01 MED ORDER — ENOXAPARIN SODIUM 40 MG/0.4ML IJ SOSY
40.0000 mg | PREFILLED_SYRINGE | INTRAMUSCULAR | Status: DC
  Administered 2021-11-01 – 2021-11-03 (×3): 40 mg via SUBCUTANEOUS
  Filled 2021-11-01 (×3): qty 0.4

## 2021-11-01 MED ORDER — POTASSIUM CHLORIDE CRYS ER 20 MEQ PO TBCR
20.0000 meq | EXTENDED_RELEASE_TABLET | ORAL | Status: AC
  Administered 2021-11-01: 20 meq via ORAL
  Filled 2021-11-01: qty 1

## 2021-11-01 MED ORDER — SODIUM CHLORIDE 0.9% FLUSH
3.0000 mL | Freq: Two times a day (BID) | INTRAVENOUS | Status: DC
  Administered 2021-11-01 – 2021-11-02 (×4): 3 mL via INTRAVENOUS

## 2021-11-01 MED ORDER — SODIUM CHLORIDE 0.9 % IV BOLUS
1000.0000 mL | Freq: Once | INTRAVENOUS | Status: AC
  Administered 2021-11-01: 1000 mL via INTRAVENOUS

## 2021-11-01 MED ORDER — MORPHINE SULFATE (PF) 2 MG/ML IV SOLN
2.0000 mg | INTRAVENOUS | Status: AC | PRN
  Administered 2021-11-01 – 2021-11-02 (×3): 2 mg via INTRAVENOUS
  Filled 2021-11-01 (×3): qty 1

## 2021-11-01 MED ORDER — SODIUM CHLORIDE 0.9 % IV SOLN: INTRAVENOUS | Status: DC

## 2021-11-01 MED ORDER — ONDANSETRON HCL 4 MG/2ML IJ SOLN
4.0000 mg | Freq: Four times a day (QID) | INTRAMUSCULAR | Status: DC | PRN
  Administered 2021-11-01 – 2021-11-02 (×2): 4 mg via INTRAVENOUS
  Filled 2021-11-01 (×2): qty 2

## 2021-11-01 MED ORDER — PHENOL 1.4 % MT LIQD: 1.0000 | OROMUCOSAL | Status: DC | PRN

## 2021-11-01 MED ORDER — ALBUTEROL SULFATE (2.5 MG/3ML) 0.083% IN NEBU: 2.5000 mg | INHALATION_SOLUTION | Freq: Four times a day (QID) | RESPIRATORY_TRACT | Status: DC | PRN

## 2021-11-01 MED ORDER — PANTOPRAZOLE SODIUM 40 MG IV SOLR
40.0000 mg | Freq: Once | INTRAVENOUS | Status: AC
Start: 2021-11-01 — End: 2021-11-01
  Administered 2021-11-01: 40 mg via INTRAVENOUS
  Filled 2021-11-01: qty 10

## 2021-11-01 MED ORDER — LIDOCAINE VISCOUS HCL 2 % MT SOLN
15.0000 mL | Freq: Once | OROMUCOSAL | Status: AC
  Administered 2021-11-01: 15 mL via OROMUCOSAL
  Filled 2021-11-01: qty 15

## 2021-11-01 NOTE — H&P (Addendum)
?History and Physical  ? ? ?Patient: Olivia Morton OJJ:[id number] DOB: [birth year] ?DOA: 10/31/2021 ?DOS: the patient was seen and examined on 11/01/2021 ?PCP: Patient, No Pcp Per (Inactive)  ?Patient coming from: Home ? ?Chief Complaint:  ?Chief Complaint  ?Patient presents with  ? Sore Throat  ? Abdominal Pain  ? ?HPI: Olivia Morton is a 31 y.o. female with medical history significant of cholecystectomy and PID who presents with complaints of sore throat and abdominal pain.  Symptoms initially started 6 days ago with complaints of sore throat and progressed to difficulty swallowing.  Pain from her throat seems to radiate behind her ear.  Patient had been seen at [facility] on 5/14 with complaints of sore throat and had negative strep screen at that time.  She was given clindamycin 300 mg p.o. and dexamethasone 10 mg p.o.  She reports being diagnosed with strep throat and discharged home on clindamycin.  She was not able to pick up the prescription until the following day because the pharmacy did not have it available.  Shortly after taking 1 dose of the medication she developed abdominal cramping with diaphoresis, cold chills, nausea, vomiting, urinary frequency, and diarrhea.  Diarrhea was noted to be nonbloody.  She is unsure if there was any blood in her emesis as she had just eaten spaghetti.  Due to her symptoms she was seen at [facility] emergency department on 5/15, but reports that she was just given antinausea medication and discharged home.  Due to persistence of symptoms she returned to [facility] yesterday for her symptoms.  Work-up included negative pregnancy screen, undetectable salicylate level, undetectable acetaminophen level, mononucleosis screen negative, potassium 3.4, AST 444, and ALT 238.  Urinalysis was positive for leukocyte Estrace.  CT scan of the abdomen and pelvis noted mild periportal edema which may be related to volume overload or hepatic inflammatory changes.  During this  time she is continue to have sore throat and reports having sharp pains across her stomach and feeling of softening of her back.  She has been taking Alka-Seltzer teas every day since symptoms started in addition to Tylenol, but does not feel like she is taking more than 2 g in a 24-hour period.  She denies having any dysuria, vaginal discharge, shortness of breath, chest pain, leg swelling.  The only other symptom that she reports is weight gain of 30 pounds over the last 3 months.  She does admit to having unprotected sexual intercourse, but states that this has not occurred frequently.  She reports drinking alcohol once or twice a year and has not done so recently.  She does admit to occasional use of marijuana and last used about 1 month ago.  Patient denies any other illicit drug use.  She is not on any other medications, but does infrequently take a supplement for hair and nails. ? ?On admission into the emergency department patient was seen to have a temperature of 99.1 ?F with vital signs otherwise maintained.  Labs significant for hemoglobin 11.4 with low MCV and MCH, potassium 3.4, lipase 29, AST 316, ALT 583, acetaminophen level less than 10, PT/INR 1.1/13.6, and all other labs relatively within normal limits.  Influenza and COVID-19 screening were negative.  Patient had been given 1 L normal saline IV fluids, Zofran, and 4 mg of morphine IV. Gastroenterology had been consulted and recommended obtaining right upper quadrant ultrasound.  Ultrasound did not show any acute abnormality.   ? ?Review of Systems: As mentioned in  the history of present illness. All other systems reviewed and are negative. ?Past Medical History:  ?Diagnosis Date  ? PID (acute pelvic inflammatory disease) 2020  ? ?Past Surgical History:  ?Procedure Laterality Date  ? BREAST SURGERY    ? Abscess to left breast  ? CHOLECYSTECTOMY    ? ?Social History:  reports that she has never smoked. She has never used smokeless tobacco. She  reports current alcohol use. She reports current drug use. Drug: Marijuana. ? ?Allergies  ?Allergen Reactions  ? Clindamycin/Lincomycin Diarrhea and Nausea And Vomiting  ?  Abdominal and back pain  ? Grapeseed Extract [Nutritional Supplements] Rash  ?  Nauseous  ? ? ?Family History  ?Problem Relation Age of Onset  ? Hypertension Mother   ? Hypertension Sister   ? ? ?Prior to Admission medications   ?Medication Sig Start Date End Date Taking? Authorizing Provider  ?acetaminophen (TYLENOL) 500 MG tablet Take 1,000 mg by mouth every 6 (six) hours as needed for moderate pain or headache.   Yes [provider]  ?clindamycin (CLEOCIN) 300 MG capsule Take 1 capsule (300 mg total) by mouth 3 (three) times daily for 10 days. ?Patient not taking: Reported on 11/01/2021 10/29/21 11/08/21  Virgina Norfolk, DO  ? ? ?Physical Exam: ?Vitals:  ? 11/01/21 0350 11/01/21 0355 11/01/21 0400 11/01/21 0655  ?BP: 119/74  121/70 122/86  ?Pulse: 65 64 67 72  ?Resp:   16 16  ?Temp:      ?TempSrc:      ?SpO2: 100% 100% 99% 100%  ?Weight:      ?Height:      ? ? ?Constitutional: Middle-age female who appears to be in no acute distress. ?Eyes: PERRL, lids and conjunctivae normal ?ENMT: Mucous membranes are moist.  Bilateral tonsillar swelling appreciated without exudate.  Normal dentition. ?Neck: Submandibular lymphadenopathy with tenderness palpation. ?Respiratory: clear to auscultation bilaterally, no wheezing, no crackles. Normal respiratory effort. No accessory muscle use.  ?Cardiovascular: Regular rate and rhythm, no murmurs / rubs / gallops. No extremity edema. 2+ pedal pulses. No carotid bruits.  ?Abdomen: Mild generalized tenderness, no masses palpated. No hepatosplenomegaly. Bowel sounds positive.  ?Musculoskeletal: no clubbing / cyanosis. No joint deformity upper and lower extremities. Good ROM, no contractures. Normal muscle tone.  ?Skin: no rashes, lesions, ulcers. No induration ?Neurologic: CN 2-12 grossly intact. Sensation  intact, DTR normal. Strength 5/5 in all 4.  ?Psychiatric: Normal judgment and insight. Alert and oriented x 3. Normal mood.  ? ?Data Reviewed: ? ?Reviewed labs, imaging, and pertinent records as noted above in HPI. ? ?Assessment and Plan: ?Abdominal pain nausea, vomiting, and diarrhea ?Acute.  Patient presents with complaints of abdominal pain, nausea, vomiting, and diarrhea after taking 1 dose of clindamycin.  Labs noted normal lipase along with undetectable acetaminophen level and salicylate level.  Urine pregnancy was negative.  Liver enzymes were noted to be elevated without elevated total bilirubin. Outside records noted negative acute hepatitis panel.  CT scan was significant for mild periportal edema with normal spleen and gallbladder surgically removed.  Question viral cause of patient's acute symptoms.  She does not drink any significant amount of alcohol to onset symptoms. ?-Admit to a MedSurg bed ?-Monitor intake and output ?-Check HIV, RPR, Epstein-Barr panel, CMV, HSV, GC chlamydia screen, and GGT ?-Recheck urinalysis ?-Protonix IV x1 dose ?-Normal saline IV fluids at 125 mL/h ?-Continue symptomatic treatment with antiemetics ?-Appreciate GI consultative services, wwill follow-up for any further recommendations ? ?Sore throat ?Symptoms initially started with sore  throat.  On physical exam patient with bilateral tonsillar swelling.  Influenza, COVID-19, mono, and strep testing were all negative. ?-Follow-up Epstein-Barr panel  ?-Check complete respiratory virus panel ?-Chloraseptic spray as needed ? ?Urinary frequency ?Urinalysis at the outside facility yesterday had noted concern for leukocyte esterase. ?-Follow-up urinalysis and urine culture ?-Follow-up GC chlamydia ? ?Transaminitis ?Acute.  [facility] repeat check today noted AST 444-> 316 and ALT 238 ->583 with total bilirubin within normal limits.  Acetaminophen levels were undetectable on recent checks and the acute hepatitis panel was  negative from [facility]. ?-Appreciate GI consultative services, will follow-up for further recommendations ? ?Hypokalemia ?Acute.  On admission potassium 3.4. ?-Give potassium chloride 20 mill equivalents p.o.

## 2021-11-01 NOTE — ED Provider Notes (Signed)
?[facility] EMERGENCY DEPARTMENT ?Provider Note ? ? ?CSN: [id number] ?Arrival date & time: 10/31/21  1829 ? ?  ? ?History ? ?Chief Complaint  ?Patient presents with  ? Sore Throat  ? Abdominal Pain  ? ? ?Olivia Morton is a 31 y.o. female. ? ?Patient presents to the emergency department for evaluation of persistent sore throat with nausea and diarrhea.  Patient was recently seen at [facility] and started on clindamycin for pharyngitis.  She reports that she took the first dose of clindamycin and developed abdominal pain, nausea, vomiting and diarrhea.  She was brought to [facility] regional at that point, was seen and discharged.  She has stopped the clindamycin. ? ? ?  ? ?Home Medications ?Prior to Admission medications   ?Medication Sig Start Date End Date Taking? Authorizing Provider  ?acetaminophen (TYLENOL) 500 MG tablet Take 1,000 mg by mouth every 6 (six) hours as needed for moderate pain or headache.   Yes [provider]  ?clindamycin (CLEOCIN) 300 MG capsule Take 1 capsule (300 mg total) by mouth 3 (three) times daily for 10 days. ?Patient not taking: Reported on 11/01/2021 10/29/21 11/08/21  Virgina Norfolk, DO  ?   ? ?Allergies    ?Clindamycin/lincomycin and Grapeseed extract [nutritional supplements]   ? ?Review of Systems   ?Review of Systems ? ?Physical Exam ?Updated Vital Signs ?BP 121/70   Pulse 67   Temp 99.1 ?F (37.3 ?C) (Oral)   Resp 16   Ht 5\' 8"  (1.727 m)   Wt 77.1 kg   LMP 10/08/2021   SpO2 99%   BMI 25.85 kg/m?  ?Physical Exam ?Vitals and nursing note reviewed.  ?Constitutional:   ?   General: She is not in acute distress. ?   Appearance: She is well-developed.  ?HENT:  ?   Head: Normocephalic and atraumatic.  ?   Mouth/Throat:  ?   Mouth: Mucous membranes are moist.  ?Eyes:  ?   General: Vision grossly intact. Gaze aligned appropriately.  ?   Extraocular Movements: Extraocular movements intact.  ?   Conjunctiva/sclera: Conjunctivae normal.   ?Cardiovascular:  ?   Rate and Rhythm: Normal rate and regular rhythm.  ?   Pulses: Normal pulses.  ?   Heart sounds: Normal heart sounds, S1 normal and S2 normal. No murmur heard. ?  No friction rub. No gallop.  ?Pulmonary:  ?   Effort: Pulmonary effort is normal. No respiratory distress.  ?   Breath sounds: Normal breath sounds.  ?Abdominal:  ?   General: Bowel sounds are normal.  ?   Palpations: Abdomen is soft.  ?   Tenderness: There is no abdominal tenderness. There is no guarding or rebound.  ?   Hernia: No hernia is present.  ?Musculoskeletal:     ?   General: No swelling.  ?   Cervical back: Full passive range of motion without pain, normal range of motion and neck supple. No spinous process tenderness or muscular tenderness. Normal range of motion.  ?   Right lower leg: No edema.  ?   Left lower leg: No edema.  ?Skin: ?   General: Skin is warm and dry.  ?   Capillary Refill: Capillary refill takes less than 2 seconds.  ?   Findings: No ecchymosis, erythema, rash or wound.  ?Neurological:  ?   General: No focal deficit present.  ?   Mental Status: She is alert and oriented to person, place, and time.  ?  GCS: GCS eye subscore is 4. GCS verbal subscore is 5. GCS motor subscore is 6.  ?   Cranial Nerves: Cranial nerves 2-12 are intact.  ?   Sensory: Sensation is intact.  ?   Motor: Motor function is intact.  ?   Coordination: Coordination is intact.  ?Psychiatric:     ?   Attention and Perception: Attention normal.     ?   Mood and Affect: Mood normal.     ?   Speech: Speech normal.     ?   Behavior: Behavior normal.  ? ? ?ED Results / Procedures / Treatments   ?Labs ?(all labs ordered are listed, but only abnormal results are displayed) ?Labs Reviewed  ?CBC WITH DIFFERENTIAL/PLATELET - Abnormal; Notable for the following components:  ?    Result Value  ? Hemoglobin 11.4 (*)   ? HCT 35.4 (*)   ? MCV 75.2 (*)   ? MCH 24.2 (*)   ? All other components within normal limits  ?COMPREHENSIVE METABOLIC PANEL -  Abnormal; Notable for the following components:  ? Potassium 3.4 (*)   ? AST 316 (*)   ? ALT 583 (*)   ? All other components within normal limits  ?ACETAMINOPHEN LEVEL - Abnormal; Notable for the following components:  ? Acetaminophen (Tylenol), Serum <10 (*)   ? All other components within normal limits  ?RESP PANEL BY RT-PCR (FLU A&B, COVID) ARPGX2  ?GROUP A STREP BY PCR  ?LIPASE, BLOOD  ?PROTIME-INR  ? ? ?EKG ?None ? ?Radiology ?No results found. ? ?Procedures ?Procedures  ? ? ?Medications Ordered in ED ?Medications  ?sodium chloride 0.9 % bolus 1,000 mL (0 mLs Intravenous Stopped 11/01/21 0504)  ?ondansetron [facility]) injection 4 mg (4 mg Intravenous Given 11/01/21 0410)  ?morphine (PF) 4 MG/ML injection 4 mg (4 mg Intravenous Given 11/01/21 0424)  ?lidocaine (XYLOCAINE) 2 % viscous mouth solution 15 mL (15 mLs Mouth/Throat Given 11/01/21 0424)  ? ? ?ED Course/ Medical Decision Making/ A&P ?  ?                        ?Medical Decision Making ?Amount and/or Complexity of Data Reviewed ?Labs: ordered. ?Radiology: ordered. ? ?Risk ?Prescription drug management. ? ? ?Presents to the emergency department for evaluation of right posterior flank and back pain with diarrhea.  Patient was recently treated for pharyngitis with Decadron and clindamycin.  After she took her first dose of clindamycin she developed acute GI symptoms including nausea, vomiting and diarrhea.  She was seen at [facility] yesterday and had a CT scan that showed periportal edema and had elevated LFTs.  Work-up was otherwise unremarkable.  Hepatitis panel pending from that visit. ? ?Patient now complaining of persistent diarrhea.  Differential diagnosis includes acute hepatitis, viral illness, colitis. ? ?Abdominal exam is benign, nontender.  She continues to complain of sore throat.  Oropharyngeal examination, however, is negative.  No swelling, erythema, exudate.  No signs of peritonsillar abscess. ? ?Patient's LFTs are persistently elevated  today.  AST has gone down slightly, ALT has increased. ? ?Discussed with Dr. Adela Lank, on-call for gastroenterology.  Recommends right upper quadrant ultrasound, observation of the patient to trend LFTs.  GI will consult. ? ? ? ? ? ? ? ?Final Clinical Impression(s) / ED Diagnoses ?Final diagnoses:  ?Generalized abdominal pain  ? ? ?Rx / DC Orders ?ED Discharge Orders   ? ? None  ? ?  ? ? ?  ?  Gilda CreasePollina, Santa Abdelrahman J, MD ?11/01/21 [phone] ? ?

## 2021-11-01 NOTE — Consult Note (Addendum)
Attending physician's note   I have taken a history, reviewed the chart, and examined the patient. I performed a substantive portion of this encounter, including complete performance of at least one of the key components, in conjunction with the APP. I agree with the APP's note, impression, and recommendations with my edits.   31 year old female with medical history as outlined below, presents with abdominal pain, nausea/vomiting and GI service consulted for evaluation of elevated liver enzymes.  She presented to [facility] on 5/14 with sore throat, diagnosed with pharyngitis and started on clindamycin and Decadron.  Developed abdominal pain and nausea/vomiting/diarrhea after 1 dose of clindamycin.    She returned to [facility] regional ER 5/15 with evaluation as below: - CT: Mild periportal edema.  Otherwise unremarkable/normal - AST/ALT 444/238, ALP 69, T. bili 1.1 - K3.4, otherwise normal BMP - WBC 8.8, H/H10.9/34.9 with MCV/RDW 75/14, PLT 203 - Negative/normal salicylates, APAP, ethanol, lipase, mono, viral hepatitis panel, group A strep swab, respiratory panel to include COVID- - Discontinued clindamycin and discharged home  Presents to [facility] ER today with continued symptoms - AST/ALT 316/583, ALP 106, T. bili 0.6 - K3.4, otherwise normal BMP.  BUN/creatinine 8/0.8 - INR 1.1 - WBC 5.8, H/H11.4/35.4 with MCV 75.  PLT 247 - Negative/normal lipase, APAP - UDS pending - RUQ [place]: e/o prior ccy, otherwise normal liver, no duct dilation    1) Elevated liver enzymes 2) Nausea/Vomiting 3) Abdominal pain 4) Diarrhea  Significantly elevated AST/ALT with otherwise normal ALP and T. bili.  No prior known history of elevated liver enzymes, and these were normal in 11/2019 (AST/ALT 15/11).  Increased suspicion for infectious etiology with possible viral prodrome vs DILI.  No serologic or  radiographic e/o obstruction  - Check EBV, HSV, CMV - Trend liver enzymes - If continued diarrhea, collect stool studies with GI PCR panel, fecal calprotectin - Antiemetics per primary service - No role for ERCP - P.o. intake as tolerated - GI service will continue to follow  Ann Held, FACG [phone] office                                                                                   [facility] Consult: 9:44 AM 11/01/2021  LOS: 0 days    Referring Provider: Dr Arlyss Queen.    Primary Care Physician:  Patient, No Pcp Per (Inactive)  using UCs and ED for her care Primary Gastroenterologist:  unassigned     Reason for Consultation:  elevated LFTs.  Abdominal pain, nausea, vomiting   HPI: Olivia Morton is a 31 y.o. female.  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  Lap chole in [facility] 01/2016.    Seen at [facility] on 5/14 for sore throat of 2 days.  No trouble with her voice or difficulty managing secretions.  Tested negative for strep.  Received clindamycin po and a dose of dexamethasone for possible atypical organism versus viral etiology.  Discharged with prescription for clindamycin.    Back to [facility] ED 5/15.  Acute onset of central abdominal pain, like a tight band wrapped around stomach.  Not localized but radiating to mid and low back.  Associated nausea, diarrhea and nonbloody emesis.  Symptoms onset after she took clindamycin that evening. No fever, tachycardia, BP 101/56. T. bili 1.1.  Alk phos 69.  AST/ALT 444/238.  Lipase 11 potassium 3.4.  APAP <10.  Salicyalate level <2.5.  Alcohol level less than 10.  Monospot negative. Hb 10.9.  MCV 75.  Platelets, WBCs normal.  Urine pregnancy negative. Viral hepatitis serologies with hepatitis B surface Ab positive but hep B core Ab and surface Ag negative.  Hepatitis A total Ab negative.  HCV negative  CTAP with contrast: Mild periportal  edema.  Pancreas normal.  Biliary tree normal.  Received Zofran, Toradol, morphine, IV normal saline bolus.  Advised to stop clindamycin no prescriptions at discharge.  Then presented to [facility] ED because of persistent sore throat, diarrhea, nonfocal abdominal pain, nonbloody nausea, vomiting.  Self treating with ibuprofen, Tylenol.  Also felt like she might have a lump on the right side of her neck. Abdominal exam benign, OP exam unremarkable.  LFTs persistently elevated. T. bili 0.6.  Alk phos 106.  AST/ALT 316/583.  Lipase 29.  Repeat APAP level <10.  INR 1.1. WBCs normal.  Hb 11.4.  MCV 75.  Platelets normal 247. Pending labs include HIV, GGT, RPR, GC chlamydia probe. Repeat ultrasound abdomen: With normal-appearing liver.  No ductal dilatation.  Portal vein Dopplers normal.  No ascites.  GB surgically absent.  Sore throat is better.  Abdominal pain better but persists.  She describes upper abdominal pain but then generalized abdominal cramping.  Last bowel movement several hours ago with brown, yellow water and some pieces of soft, formed stool.  No blood.  For past few weeks she has had increased burping which she describes as sizzling burps, as if there is some liquid in there along with just the air.  No signs of heartburn. Does not take any prescribed or over-the-counter medications other than occasional Tylenol or ibuprofen but using these in large quantities.   Provider suspected this was a viral illness with transient LFT elevation.  Patient's symptoms improved and she was discharged home with prescription for Zofran.  Patient does not drink. History negative for liver disease, colorectal/GI cancers, ulcer disease, reflux disease.    Past Medical History:  Diagnosis Date   PID (acute pelvic inflammatory disease) 2020    Past Surgical History:  Procedure Laterality Date   BREAST SURGERY     Abscess to left breast   CHOLECYSTECTOMY      Prior to Admission medications    Medication Sig Start Date End Date Taking? Authorizing Provider  acetaminophen (TYLENOL) 500 MG tablet Take 1,000 mg by mouth every 6 (six) hours as needed for moderate pain or headache.   Yes [provider]  clindamycin (CLEOCIN) 300 MG capsule Take 1 capsule (300 mg total) by mouth 3 (three) times daily for 10 days. Patient not taking: Reported on 11/01/2021 10/29/21 11/08/21  Virgina Norfolk, DO    Scheduled Meds:  enoxaparin (LOVENOX) injection  40 mg Subcutaneous Q24H   pantoprazole (PROTONIX) IV  40 mg Intravenous Once   potassium chloride  20 mEq Oral STAT   sodium chloride flush  3 mL Intravenous Q12H   Infusions:  sodium chloride     PRN Meds: albuterol, hydrALAZINE, ondansetron (ZOFRAN) IV, phenol   Allergies as of 10/31/2021 - Review Complete 10/31/2021  Allergen Reaction Noted   Grapeseed extract [nutritional supplements] Rash 12/03/2019    Family History  Problem Relation Age of Onset   Hypertension Mother    Hypertension Sister     Social History   Socioeconomic History   Marital status: Single    Spouse name: Not on file   Number of children: Not on file   Years of education: Not on file   Highest education level: Not on file  Occupational History   Not on file  Tobacco Use   Smoking status: Never   Smokeless tobacco: Never  Vaping Use   Vaping Use: Never used  Substance and Sexual Activity   Alcohol use: Yes    Comment: Ocassionally   Drug use: Yes    Types: Marijuana    Comment: occasional   Sexual activity: Yes    Birth control/protection: I.U.D.    Comment: Males  Other Topics Concern   Not on file  Social History Narrative   Not on file   Social Determinants of Health   Financial Resource Strain: Not on file  Food Insecurity: Not on file  Transportation Needs: Not on file  Physical Activity: Not on file  Stress: Not on file  Social Connections: Not on file  Intimate Partner Violence: Not on file    REVIEW OF  SYSTEMS: Constitutional: No weakness.  No fatigue ENT:  No nose bleeds Pulm: No cough or Shortness of breath CV:  No palpitations, no LE edema.  No angina GU:  No hematuria, no frequency GYN: Since IUD removed about a year ago she says she has had regular but heavier periods. GI: See HPI.  No dysphagia. Heme: No unusual or excessive bleeding or bruising Transfusions: None Neuro:  No headaches, no peripheral tingling or numbness Derm:  No itching, no rash or sores.  Endocrine:  No sweats or chills.  No polyuria or dysuria Immunization: Not queried. Travel: Not queried.   PHYSICAL EXAM: Vital signs in last 24 hours: Vitals:   11/01/21 0655 11/01/21 0700  BP: 122/86 125/88  Pulse: 72 80  Resp: 16   Temp:    SpO2: 100% 99%   Wt Readings from Last 3 Encounters:  10/31/21 77.1 kg  10/29/21 77.1 kg  12/03/19 69 kg    General: Pleasant, well-appearing, comfortable.  On stretcher in hallway of ED. Head: No facial asymmetry or swelling.  No signs of head trauma. Eyes: No scleral icterus or conjunctival pallor. Ears: No hearing deficit Nose: No congestion or discharge Mouth: Excellent teeth.  Mucosa is moist, pink, clear.  Tongue midline. Neck: No JVD, no masses, no thyromegaly. Lungs: Clear bilaterally.  No labored breathing or cough Heart: RRR.  No MRG.  S1, S2 present Abdomen: Soft.  Minor, nonfocal tenderness throughout.  No HSM, masses, bruits, hernias.  Bowel sounds normal, active..   Rectal: Deferred Musc/Skeltl: No gross joint swelling or deformities Extremities: No CCE Neurologic: Alert.  Oriented x3.  Moves all 4 limbs without gross deficits but strength not tested.  No tremors. Skin: No suspicious lesions, sores or bruising Nodes: No cervical adenopathy Psych: Calm, cooperative.  Pleasant aspect.  Fluid speech.  Intake/Output from previous day: No intake/output data recorded. Intake/Output this shift: No intake/output data recorded.  LAB RESULTS: Recent Labs     11/01/21 0408  WBC 5.8  HGB 11.4*  HCT 35.4*  PLT 247   BMET Lab Results  Component Value Date   NA 136 11/01/2021   NA 139 12/03/2019   NA 138 03/07/2008   K 3.4 (L) 11/01/2021   K 3.6 12/03/2019   K 3.8  03/07/2008   CL 106 11/01/2021   CL 104 12/03/2019   CL 106 03/07/2008   CO2 23 11/01/2021   CO2 25 12/03/2019   CO2 25 03/07/2008   GLUCOSE 90 11/01/2021   GLUCOSE 97 12/03/2019   GLUCOSE 83 03/07/2008   BUN 8 11/01/2021   BUN 13 12/03/2019   BUN 10 03/07/2008   CREATININE 0.79 11/01/2021   CREATININE 0.78 12/03/2019   CREATININE 0.8 03/07/2008   CALCIUM 9.1 11/01/2021   CALCIUM 8.8 (L) 12/03/2019   CALCIUM 9.2 03/07/2008   LFT Recent Labs    11/01/21 0408  PROT 6.9  ALBUMIN 3.7  AST 316*  ALT 583*  ALKPHOS 106  BILITOT 0.6   PT/INR Lab Results  Component Value Date   INR 1.1 11/01/2021   Hepatitis Panel No results for input(s): HEPBSAG, HCVAB, HEPAIGM, HEPBIGM in the last 72 hours. C-Diff No components found for: CDIFF Lipase     Component Value Date/Time   LIPASE 29 11/01/2021 0408    Drugs of Abuse  No results found for: LABOPIA, COCAINSCRNUR, LABBENZ, AMPHETMU, THCU, LABBARB   RADIOLOGY STUDIES: US Abdomen Limited RUQ (LIVER/GB)  Result Date: 11/01/2021 CLINICAL DATA:  Abdominal pain EXAM: ULTRASOUND ABDOMEN LIMITED RIGHT UPPER QUADRANT COMPARISON:  CT from previous day FINDINGS: Gallbladder: Surgically absent Common bile duct: Diameter: 4 mm, unremarkable Liver: No focal lesion identified. Within normal limits in parenchymal echogenicity. No definite intrahepatic biliary ductal dilatation. Portal vein is patent on color Doppler imaging with normal direction of blood flow towards the liver. Other: No ascites identified. IMPRESSION: 1. Negative post cholecystectomy. Electronically Signed   By: Corlis Leak M.D.   On: 11/01/2021 07:28      IMPRESSION:     Acute elevation LFTs w diarhea abd pain, n/v after dose of Clindamycin for sore  throat 2 d ago.  Lap chole 2017.  CT scan 5/15 w mild periportal edema, otherwise unremarkable study.  Ultrasound 5/16 normal.  Suspect acute drug injury/DILI.  Rule out other viral etiologies.    Minor anemia low MCV.  Patient reports heavy menstrual bleeding.    PLAN:       EBV, HSV, CMV testing ordered.  Iron studies ordered.  Follow LFTs  Advance to regular diet as tolerated   Jennye Moccasin  11/01/2021, 9:44 AM Phone [phone]

## 2021-11-01 NOTE — ED Notes (Signed)
Attempted report x 2 

## 2021-11-02 ENCOUNTER — Telehealth: Payer: Self-pay

## 2021-11-02 DIAGNOSIS — R7401 Elevation of levels of liver transaminase levels: Secondary | ICD-10-CM

## 2021-11-02 DIAGNOSIS — R748 Abnormal levels of other serum enzymes: Secondary | ICD-10-CM

## 2021-11-02 DIAGNOSIS — R1084 Generalized abdominal pain: Secondary | ICD-10-CM

## 2021-11-02 LAB — COMPREHENSIVE METABOLIC PANEL
ALT: 318 U/L — ABNORMAL HIGH (ref 0–44)
AST: 94 U/L — ABNORMAL HIGH (ref 15–41)
Albumin: 3 g/dL — ABNORMAL LOW (ref 3.5–5.0)
Alkaline Phosphatase: 77 U/L (ref 38–126)
Anion gap: 5 (ref 5–15)
BUN: <5 mg/dL — ABNORMAL LOW (ref 6–20)
CO2: 22 mmol/L (ref 22–32)
Calcium: 8.5 mg/dL — ABNORMAL LOW (ref 8.9–10.3)
Chloride: 110 mmol/L (ref 98–111)
Creatinine, Ser: 0.62 mg/dL (ref 0.44–1.00)
GFR, Estimated: >60 mL/min (ref >60)
Glucose, Bld: 98 mg/dL (ref 70–99)
Potassium: 3.5 mmol/L (ref 3.5–5.1)
Sodium: 137 mmol/L (ref 135–145)
Total Bilirubin: 0.7 mg/dL (ref 0.3–1.2)
Total Protein: 5.6 g/dL — ABNORMAL LOW (ref 6.5–8.1)

## 2021-11-02 LAB — CBC
HCT: 31.5 % — ABNORMAL LOW (ref 36.0–46.0)
Hemoglobin: 10.2 g/dL — ABNORMAL LOW (ref 12.0–15.0)
MCH: 23.9 pg — ABNORMAL LOW (ref 26.0–34.0)
MCHC: 32.4 g/dL (ref 30.0–36.0)
MCV: 73.9 fL — ABNORMAL LOW (ref 80.0–100.0)
Platelets: 213 10*3/uL (ref 150–400)
RBC: 4.26 MIL/uL (ref 3.87–5.11)
RDW: 13.6 % (ref 11.5–15.5)
WBC: 5.4 10*3/uL (ref 4.0–10.5)
nRBC: 0 % (ref 0.0–0.2)

## 2021-11-02 LAB — GC/CHLAMYDIA PROBE AMP (~~LOC~~) NOT AT ARMC
Chlamydia: NEGATIVE
Comment: NEGATIVE
Comment: NORMAL
Neisseria Gonorrhea: NEGATIVE

## 2021-11-02 LAB — URINE CULTURE: Culture: NO GROWTH

## 2021-11-02 LAB — RPR: RPR Ser Ql: NONREACTIVE

## 2021-11-02 NOTE — Telephone Encounter (Signed)
Lab order and reminder in epic.  

## 2021-11-02 NOTE — Telephone Encounter (Signed)
-----   Message from Unk Lightning, [place] sent at 11/02/2021 11:52 AM EDT ----- Regarding: LFT Can you please place an order for repeat hepatic function panel in a week for this patient for elevated LFTs.  They can be put under my name or Dr. Frankey Shown.  Thanks, JLL

## 2021-11-02 NOTE — Progress Notes (Signed)
[facility]                                                                               [name], is a 31 y.o. female, DOB - 23-Oct-1990, WSF:[id number] Admit date - 10/31/2021    Outpatient Primary MD for the patient is Patient, No Pcp Per (Inactive)  LOS - 0  days    Brief summary   Olivia Morton is a 31 y.o. female with medical history significant of cholecystectomy and PID who presents with complaints of sore throat and abdominal pain.  Symptoms initially started 6 days ago with complaints of sore throat and progressed to difficulty swallowing.   Patient had been seen at [facility] on 5/14 with complaints of sore throat and had negative strep screen at that time.  She was given clindamycin 300 mg p.o. and dexamethasone 10 mg p.o.  She reports being diagnosed with strep throat and discharged home on clindamycin.  She was not able to pick up the prescription until the following day because the pharmacy did not have it available.  Shortly after taking 1 dose of the medication she developed abdominal cramping with diaphoresis, cold chills, nausea, vomiting, urinary frequency, and diarrhea.    Assessment & Plan    Assessment and Plan:   Abdominal pain with nausea, vomiting and diarrhea:  Symptoms have improved. But she reports persistent nausea,. Liver enzymes have improved.  GI consulted and she was cleared to follow up outpatient.  Gently hydrated. Pt wanted to stay overnight and make sure she doesn't have recurrent symptoms.  Diet advanced.     Transaminitis:  GI consulted.  Liver enzymes are improving.  She reports her nausea has improved but not resolved.    Hypokalemia:  Replaced.  Repeat levels wnl.    Microcytic anemia:  Hemoglobin around 10.2. Anemia panel reviewed.         Estimated body mass index is 25.85 kg/m as calculated from the following:   Height as of this encounter: 5\' 8"  (1.727 m).   Weight as of this encounter: 77.1  kg.  Code Status: full code.  DVT Prophylaxis:  enoxaparin (LOVENOX) injection 40 mg Start: 11/01/21 0845   Level of Care: Level of care: Med-Surg Family Communication: None at bedside.   Disposition Plan:     Remains inpatient appropriate:  transaminitis.   Procedures:  None.   Consultants:   GI consulted.   Antimicrobials:   Anti-infectives (From admission, onward)    None        Medications  Scheduled Meds:  enoxaparin (LOVENOX) injection  40 mg Subcutaneous Q24H   sodium chloride flush  3 mL Intravenous Q12H   Continuous Infusions:  sodium chloride 125 mL/hr at 11/02/21 1504   PRN Meds:.albuterol, hydrALAZINE, morphine injection, ondansetron (ZOFRAN) IV, phenol    Subjective:   [name] was seen and examined today.  Nauseated. No vomiting. Abd pain has improved.   Objective:   Vitals:   11/01/21 2324 11/02/21 0622 11/02/21 0847 11/02/21 1229  BP: 125/70 114/74 124/75 112/74  Pulse: 84 74 97 69  Resp: 18 17 18 18   Temp: 98.3 F (36.8 C) 98.2 F (  36.8 C) 98.4 F (36.9 C) 98 F (36.7 C)  TempSrc: Oral Oral Oral Oral  SpO2: 100% 100% 100% 100%  Weight:      Height:        Intake/Output Summary (Last 24 hours) at 11/02/2021 1840 Last data filed at 11/02/2021 1600 Gross per 24 hour  Intake 2909.32 ml  Output 1 ml  Net 2908.32 ml   Filed Weights   10/31/21 1855  Weight: 77.1 kg     Exam General exam: Appears calm and comfortable  Respiratory system: Clear to auscultation. Respiratory effort normal. Cardiovascular system: S1 & S2 heard, RRR. No JVD,  No pedal edema. Gastrointestinal system: Abdomen is nondistended, soft and mildly tender. Normal bowel sounds heard. Central nervous system: Alert and oriented. No focal neurological deficits. Extremities: Symmetric 5 x 5 power. Skin: No rashes, lesions or ulcers Psychiatry: pt is calm, mood is appropriate.     Data Reviewed:  I have personally reviewed following labs and imaging  studies   CBC Lab Results  Component Value Date   WBC 5.4 11/02/2021   RBC 4.26 11/02/2021   HGB 10.2 (L) 11/02/2021   HCT 31.5 (L) 11/02/2021   MCV 73.9 (L) 11/02/2021   MCH 23.9 (L) 11/02/2021   PLT 213 11/02/2021   MCHC 32.4 11/02/2021   RDW 13.6 11/02/2021   LYMPHSABS 2.0 11/01/2021   MONOABS 0.5 11/01/2021   EOSABS 0.1 11/01/2021   BASOSABS 0.0 11/01/2021     Last metabolic panel Lab Results  Component Value Date   NA 137 11/02/2021   K 3.5 11/02/2021   CL 110 11/02/2021   CO2 22 11/02/2021   BUN <5 (L) 11/02/2021   CREATININE 0.62 11/02/2021   GLUCOSE 98 11/02/2021   GFRNONAA >60 11/02/2021   GFRAA >60 12/03/2019   CALCIUM 8.5 (L) 11/02/2021   PROT 5.6 (L) 11/02/2021   ALBUMIN 3.0 (L) 11/02/2021   BILITOT 0.7 11/02/2021   ALKPHOS 77 11/02/2021   AST 94 (H) 11/02/2021   ALT 318 (H) 11/02/2021   ANIONGAP 5 11/02/2021    CBG (last 3)  No results for input(s): GLUCAP in the last 72 hours.    Coagulation Profile: Recent Labs  Lab 11/01/21 0536  INR 1.1     Radiology Studies: US Abdomen Limited RUQ (LIVER/GB)  Result Date: 11/01/2021 CLINICAL DATA:  Abdominal pain EXAM: ULTRASOUND ABDOMEN LIMITED RIGHT UPPER QUADRANT COMPARISON:  CT from previous day FINDINGS: Gallbladder: Surgically absent Common bile duct: Diameter: 4 mm, unremarkable Liver: No focal lesion identified. Within normal limits in parenchymal echogenicity. No definite intrahepatic biliary ductal dilatation. Portal vein is patent on color Doppler imaging with normal direction of blood flow towards the liver. Other: No ascites identified. IMPRESSION: 1. Negative post cholecystectomy. Electronically Signed   By: Corlis Leak M.D.   On: 11/01/2021 07:28       Kathlen Mody M.D. [facility] 11/02/2021, 6:40 PM  Available via Epic secure chat 7am-7pm After 7 pm, please refer to night coverage provider listed on amion.

## 2021-11-02 NOTE — TOC Initial Note (Addendum)
Transition of Care [facility]) - Initial/Assessment Note    Patient Details  Name: Leesha Veno MRN: [id number] Date of Birth: [birth year]  [facility] CM/SW Contact:    Kingsley Plan, RN Phone Number: 11/02/2021, 12:40 PM  Clinical Narrative:                 Spoke to patient at bedside. Confirmed face sheet information.    Patient does not have PCP or insurance.   Discussed follow up at a [facility] clinic . Patient in agreement . NCM will call and schedule an appointment and place information on AVS.   First available appointment at [facility] clinics is with Dr Alvis Lemmings December 04, 2021 at 0930 am   Discussed [facility]. NCM will ask MD to send prescriptions to [facility] who will call patient with cost and deliver medications to room prior to discharge. Patient in agreement.      Expected Discharge Plan: Home/Self Care Barriers to Discharge: Continued Medical Work up   Patient Goals and CMS Choice Patient states their goals for this hospitalization and ongoing recovery are:: to return to home CMS Medicare.gov Compare Post Acute Care list provided to:: Patient    Expected Discharge Plan and Services Expected Discharge Plan: Home/Self Care In-house Referral: Financial Counselor Discharge Planning Services: Indigent Health Clinic, Medication Assistance, Follow-up appt scheduled   Living arrangements for the past 2 months: Single Family Home                 DME Arranged: N/A                    Prior Living Arrangements/Services Living arrangements for the past 2 months: Single Family Home   Patient language and need for interpreter reviewed:: Yes Do you feel safe going back to the place where you live?: Yes      Need for Family Participation in Patient Care: Yes (Comment) Care giver support system in place?: Yes (comment)   Criminal Activity/Legal Involvement Pertinent to Current Situation/Hospitalization: No - Comment as needed  Activities of Daily Living       Permission Sought/Granted   Permission granted to share information with : No              Emotional Assessment Appearance:: Appears stated age Attitude/Demeanor/Rapport: Engaged Affect (typically observed): Accepting Orientation: : Oriented to Self, Oriented to  Time, Oriented to Place, Oriented to Situation Alcohol / Substance Use: Not Applicable Psych Involvement: No (comment)  Admission diagnosis:  Generalized abdominal pain [R10.84] Abdominal pain [R10.9] Patient Active Problem List   Diagnosis Date Noted   Abdominal pain 11/01/2021   Hypokalemia 11/01/2021   Transaminitis 11/01/2021   Microcytic hypochromic anemia 11/01/2021   Urinary frequency 11/01/2021   Sore throat 11/01/2021   Nausea, vomiting, and diarrhea 11/01/2021   Weight gain 11/01/2021   Elevated liver enzymes    PCP:  Patient, No Pcp Per (Inactive) Pharmacy:   [id number] [place] [address] [place] [address] Phone: [phone] Fax: [phone]  [facility] [address] [place] [address] Phone: [phone] Fax: [phone]     Social Determinants of Health (SDOH) Interventions    Readmission Risk Interventions     View : No data to display.

## 2021-11-02 NOTE — Plan of Care (Signed)
  Problem: Clinical Measurements: Goal: Will remain free from infection Outcome: Progressing   Problem: Clinical Measurements: Goal: Diagnostic test results will improve Outcome: Progressing   Problem: Coping: Goal: Level of anxiety will decrease Outcome: Progressing   Problem: Pain Managment: Goal: General experience of comfort will improve Outcome: Progressing   

## 2021-11-02 NOTE — Progress Notes (Addendum)
Attending physician's note   I have taken a history, reviewed the chart, and examined the patient. I performed a substantive portion of this encounter, including complete performance of at least one of the key components, in conjunction with the APP. I agree with the APP's note, impression, and recommendations with my edits.   No further nausea/vomiting.  No BM overnight.  Pain much improved.  Liver enzymes downtrending.  Suspect either infectious etiology (typically viral) vs DILI.  As liver enzymes are downtrending and symptoms improving, suspect she is okay for discharge later today.  Plan for outpatient labs in 10 days or so to ensure normalization of enzymes.  If enzymes are normal, no specific GI follow-up needed.  GI service will sign off at this time.  [place], DO, FACG [phone] office          Progress Note   Subjective  Chief Complaint: Elevated LFTs, abdominal pain, nausea and vomiting  Today, the patient tells me that no one has really told her what is going on in her body.  She is somewhat disappointed that she had to sit in the hallway of the ER for many hours yesterday and discuss things "out in the hallway", that were private.  She tells me that the doctors that she did see she did not see for very long.  She feels about the same this morning, no further nausea or vomiting overnight.  Denies any new complaints or concerns.  She still believes that all of this is from her 1 dose of antibiotic.  Also relates some low back pain since taking Clindamycin.   Objective   Vital signs in last 24 hours: Temp:  [98.2 F (36.8 C)-99 F (37.2 C)] 98.4 F (36.9 C) (05/18 0847) Pulse Rate:  [74-97] 97 (05/18 0847) Resp:  [16-18] 18 (05/18 0847) BP: (111-129)/(69-84) 124/75 (05/18 0847) SpO2:  [100 %] 100 % (05/18 0847) Last BM Date : 11/01/21 General:    AA female in NAD Heart:  Regular rate and rhythm; no murmurs Lungs: Respirations even and unlabored,  lungs CTA bilaterally Abdomen:  Soft, nontender and nondistended. Normal bowel sounds. Psych:  Cooperative. Normal mood and affect.  Intake/Output from previous day: 05/17 0701 - 05/18 0700 In: 2429.3 [P.O.:240; I.V.:2189.3] Out: -    Lab Results: Recent Labs    11/01/21 0408 11/02/21 0031  WBC 5.8 5.4  HGB 11.4* 10.2*  HCT 35.4* 31.5*  PLT 247 213   BMET Recent Labs    11/01/21 0408 11/02/21 0031  NA 136 137  K 3.4* 3.5  CL 106 110  CO2 23 22  GLUCOSE 90 98  BUN 8 <5*  CREATININE 0.79 0.62  CALCIUM 9.1 8.5*      Latest Ref Rng & Units 11/02/2021   12:31 AM 11/01/2021    4:08 AM 12/03/2019    7:51 AM  Hepatic Function  Total Protein 6.5 - 8.1 g/dL 5.6   6.9   7.3    Albumin 3.5 - 5.0 g/dL 3.0   3.7   4.0    AST 15 - 41 U/L 94   316   15    ALT 0 - 44 U/L 318   583   11    Alk Phosphatase 38 - 126 U/L 77   106   48    Total Bilirubin 0.3 - 1.2 mg/dL 0.7   0.6   0.5       PT/INR Recent Labs  11/01/21 0536  LABPROT 13.6  INR 1.1    Studies/Results: US Abdomen Limited RUQ (LIVER/GB)  Result Date: 11/01/2021 CLINICAL DATA:  Abdominal pain EXAM: ULTRASOUND ABDOMEN LIMITED RIGHT UPPER QUADRANT COMPARISON:  CT from previous day FINDINGS: Gallbladder: Surgically absent Common bile duct: Diameter: 4 mm, unremarkable Liver: No focal lesion identified. Within normal limits in parenchymal echogenicity. No definite intrahepatic biliary ductal dilatation. Portal vein is patent on color Doppler imaging with normal direction of blood flow towards the liver. Other: No ascites identified. IMPRESSION: 1. Negative post cholecystectomy. Electronically Signed   By: Lucrezia Europe M.D.   On: 11/01/2021 07:28     Assessment / Plan:   Assessment: 1.  Acute elevation of LFTs with diarrhea, abdominal pain, nausea and vomiting: 2.  Minor anemia: Reports heavy menstrual bleeding  Plan: 1.  LFTs are trending down today.  CMV, EBV and HSV are still pending. 2.  We will recheck LFTs as  an outpatient in 10 days.  We will get this arranged for her at our outpatient clinic.  If they continue to trend down and normalize then there is no need for further follow-up, if they remain elevated at that time then we will pursue further work-up. 3.  Okay with patient discharge today.  Thank you for your kind consultation, we will sign off.    LOS: 0 days   Levin Erp  11/02/2021, 11:46 AM

## 2021-11-03 ENCOUNTER — Encounter (HOSPITAL_COMMUNITY): Payer: Self-pay | Admitting: Surgery

## 2021-11-03 LAB — CMV IGM: CMV IgM: <30 AU/mL (ref 0.0–29.9)

## 2021-11-03 LAB — EPSTEIN-BARR VIRUS (EBV) ANTIBODY PROFILE
EBV NA IgG: 496 U/mL — ABNORMAL HIGH (ref 0.0–17.9)
EBV VCA IgG: >600 U/mL — ABNORMAL HIGH (ref 0.0–17.9)
EBV VCA IgM: <36 U/mL (ref 0.0–35.9)

## 2021-11-03 NOTE — Progress Notes (Signed)
Patient discharged to home with instructions. 

## 2021-11-04 NOTE — Discharge Summary (Signed)
Physician Discharge Summary   Patient: Olivia LeitzMariah Morton MRN: [id number] DOB: [birth year]  Admit date:     10/31/2021  Discharge date: 11/03/2021  Discharge Physician: Kathlen ModyVijaya Yacob Wilkerson   PCP: Patient, No Pcp Per (Inactive)   Recommendations at discharge:  Please follow up with PCP in one week.  Please follow up with liver enzymes in 1 to 2 weeks.  Please follow up with Gastroenterology as scheduled.   Discharge Diagnoses: Principal Problem:   Abdominal pain Active Problems:   Nausea, vomiting, and diarrhea   Sore throat   Transaminitis   Urinary frequency   Hypokalemia   Microcytic hypochromic anemia   Weight gain   Hospital Course:  Olivia Morton is a 31 y.o. female with medical history significant of cholecystectomy and PID who presents with complaints of sore throat and abdominal pain.  Symptoms initially started 6 days ago with complaints of sore throat and progressed to difficulty swallowing.   Patient had been seen at [facility] on 5/14 with complaints of sore throat and had negative strep screen at that time.  She was given clindamycin 300 mg p.o. and dexamethasone 10 mg p.o.  She reports being diagnosed with strep throat and discharged home on clindamycin.  She was not able to pick up the prescription until the following day because the pharmacy did not have it available.  Shortly after taking 1 dose of the medication she developed abdominal cramping with diaphoresis, cold chills, nausea, vomiting, urinary frequency, and diarrhea.     Assessment and Plan:    Abdominal pain with nausea, vomiting and diarrhea:  Symptoms have improved. But she reports persistent nausea,. Liver enzymes have improved.  GI consulted and she was cleared to follow up outpatient.  Gently hydrated. Pt wanted to stay overnight and make sure she doesn't have recurrent symptoms.  Diet advanced.      Transaminitis:  GI consulted.  Liver enzymes are improving.  She reports her nausea has improved  but not resolved.     Hypokalemia:  Replaced.  Repeat levels wnl.      Microcytic anemia:  Hemoglobin around 10.2. Anemia panel reviewed.        Consultants: gastroenterology.  Procedures performed: none.   Disposition: Home Diet recommendation:  Discharge Diet Orders (From admission, onward)     Start     Ordered   11/03/21 0000  Diet - low sodium heart healthy        11/03/21 0831           Regular diet DISCHARGE MEDICATION: Allergies as of 11/03/2021       Reactions   Clindamycin/lincomycin Diarrhea, Nausea And Vomiting   Abdominal and back pain   Grapeseed Extract [nutritional Supplements] Rash   Nauseous        Medication List     STOP taking these medications    clindamycin 300 MG capsule Commonly known as: CLEOCIN       TAKE these medications    acetaminophen 500 MG tablet Commonly known as: TYLENOL Take 1,000 mg by mouth every 6 (six) hours as needed for moderate pain or headache.        Follow-up Information     Hoy RegisterNewlin, Enobong, MD Follow up.   Specialty: Family Medicine Why: December 04, 2021 at 0930 am Contact information: [address] [place] [id number] [phone]                Discharge Exam: Ceasar MonsFiled Weights   10/31/21 1855  Weight: 77.1 kg   General exam: Appears calm and comfortable  Respiratory system: Clear to auscultation. Respiratory effort normal. Cardiovascular system: S1 & S2 heard, RRR. No JVD, murmurs, rubs, gallops or clicks. No pedal edema. Gastrointestinal system: Abdomen is nondistended, soft and nontender. No organomegaly or masses felt. Normal bowel sounds heard. Central nervous system: Alert and oriented. No focal neurological deficits. Extremities: Symmetric 5 x 5 power. Skin: No rashes, lesions or ulcers Psychiatry: Judgement and insight appear normal. Mood & affect appropriate.    Condition at discharge: fair  The results of significant diagnostics from this  hospitalization (including imaging, microbiology, ancillary and laboratory) are listed below for reference.   Imaging Studies: US Abdomen Limited RUQ (LIVER/GB)  Result Date: 11/01/2021 CLINICAL DATA:  Abdominal pain EXAM: ULTRASOUND ABDOMEN LIMITED RIGHT UPPER QUADRANT COMPARISON:  CT from previous day FINDINGS: Gallbladder: Surgically absent Common bile duct: Diameter: 4 mm, unremarkable Liver: No focal lesion identified. Within normal limits in parenchymal echogenicity. No definite intrahepatic biliary ductal dilatation. Portal vein is patent on color Doppler imaging with normal direction of blood flow towards the liver. Other: No ascites identified. IMPRESSION: 1. Negative post cholecystectomy. Electronically Signed   By: Corlis Leak M.D.   On: 11/01/2021 07:28    Microbiology: Results for orders placed or performed during the hospital encounter of 10/31/21  Resp Panel by RT-PCR (Flu A&B, Covid) Nasopharyngeal Swab     Status: None   Collection Time: 10/31/21  7:52 PM   Specimen: Nasopharyngeal Swab; Nasopharyngeal(NP) swabs in vial transport medium  Result Value Ref Range Status   SARS Coronavirus 2 by RT PCR NEGATIVE NEGATIVE Final    Comment: (NOTE) SARS-CoV-2 target nucleic acids are NOT DETECTED.  The SARS-CoV-2 RNA is generally detectable in upper respiratory specimens during the acute phase of infection. The lowest concentration of SARS-CoV-2 viral copies this assay can detect is 138 copies/mL. A negative result does not preclude SARS-Cov-2 infection and should not be used as the sole basis for treatment or other patient management decisions. A negative result may occur with  improper specimen collection/handling, submission of specimen other than nasopharyngeal swab, presence of viral mutation(s) within the areas targeted by this assay, and inadequate number of viral copies(<138 copies/mL). A negative result must be combined with clinical observations, patient history, and  epidemiological information. The expected result is Negative.  Fact Sheet for Patients:  BloggerCourse.com  Fact Sheet for Healthcare Providers:  SeriousBroker.it  This test is no t yet approved or cleared by the [place] FDA and  has been authorized for detection and/or diagnosis of SARS-CoV-2 by FDA under an Emergency Use Authorization (EUA). This EUA will remain  in effect (meaning this test can be used) for the duration of the COVID-19 declaration under Section 564(b)(1) of the Act, 21 U.S.C.section 360bbb-3(b)(1), unless the authorization is terminated  or revoked sooner.       Influenza A by PCR NEGATIVE NEGATIVE Final   Influenza B by PCR NEGATIVE NEGATIVE Final    Comment: (NOTE) The Xpert Xpress SARS-CoV-2/FLU/RSV plus assay is intended as an aid in the diagnosis of influenza from Nasopharyngeal swab specimens and should not be used as a sole basis for treatment. Nasal washings and aspirates are unacceptable for Xpert Xpress SARS-CoV-2/FLU/RSV testing.  Fact Sheet for Patients: BloggerCourse.com  Fact Sheet for Healthcare Providers: SeriousBroker.it  This test is not yet approved or cleared by the [place] FDA and has been authorized for detection and/or diagnosis of SARS-CoV-2 by FDA under an  Emergency Use Authorization (EUA). This EUA will remain in effect (meaning this test can be used) for the duration of the COVID-19 declaration under Section 564(b)(1) of the Act, 21 U.S.C. section 360bbb-3(b)(1), unless the authorization is terminated or revoked.  Performed at [facility], [address], [place], [place] [address]   Group A Strep by PCR     Status: None   Collection Time: 10/31/21  7:52 PM   Specimen: Nasopharyngeal Swab; Sterile Swab  Result Value Ref Range Status   Group A Strep by PCR NOT DETECTED NOT DETECTED Final    Comment:  Performed at [facility], [address] [place], [place] [address]  Urine Culture     Status: None   Collection Time: 11/01/21  8:28 AM   Specimen: Urine, Clean Catch  Result Value Ref Range Status   Specimen Description URINE, CLEAN CATCH  Final   Special Requests NONE  Final   Culture   Final    NO GROWTH Performed at [facility], [address], [place], [place] [address]    Report Status 11/02/2021 FINAL  Final  Respiratory (~20 pathogens) panel by PCR     Status: None   Collection Time: 11/01/21  9:33 PM   Specimen: Nasopharyngeal Swab; Respiratory  Result Value Ref Range Status   Adenovirus NOT DETECTED NOT DETECTED Final   Coronavirus 229E NOT DETECTED NOT DETECTED Final    Comment: (NOTE) The Coronavirus on the Respiratory Panel, DOES NOT test for the novel  Coronavirus (2019 nCoV)    Coronavirus HKU1 NOT DETECTED NOT DETECTED Final   Coronavirus NL63 NOT DETECTED NOT DETECTED Final   Coronavirus OC43 NOT DETECTED NOT DETECTED Final   Metapneumovirus NOT DETECTED NOT DETECTED Final   Rhinovirus / Enterovirus NOT DETECTED NOT DETECTED Final   Influenza A NOT DETECTED NOT DETECTED Final   Influenza B NOT DETECTED NOT DETECTED Final   Parainfluenza Virus 1 NOT DETECTED NOT DETECTED Final   Parainfluenza Virus 2 NOT DETECTED NOT DETECTED Final   Parainfluenza Virus 3 NOT DETECTED NOT DETECTED Final   Parainfluenza Virus 4 NOT DETECTED NOT DETECTED Final   Respiratory Syncytial Virus NOT DETECTED NOT DETECTED Final   Bordetella pertussis NOT DETECTED NOT DETECTED Final   Bordetella Parapertussis NOT DETECTED NOT DETECTED Final   Chlamydophila pneumoniae NOT DETECTED NOT DETECTED Final   Mycoplasma pneumoniae NOT DETECTED NOT DETECTED Final    Comment: Performed at [facility], [address], [place], [place] [address]    Labs: CBC: Recent Labs  Lab 11/01/21 0408 11/02/21 0031  WBC 5.8 5.4  NEUTROABS 3.2  --   HGB 11.4* 10.2*  HCT 35.4*  31.5*  MCV 75.2* 73.9*  PLT 247 213   Basic Metabolic Panel: Recent Labs  Lab 11/01/21 0408 11/02/21 0031  NA 136 137  K 3.4* 3.5  CL 106 110  CO2 23 22  GLUCOSE 90 98  BUN 8 <5*  CREATININE 0.79 0.62  CALCIUM 9.1 8.5*   Liver Function Tests: Recent Labs  Lab 11/01/21 0408 11/02/21 0031  AST 316* 94*  ALT 583* 318*  ALKPHOS 106 77  BILITOT 0.6 0.7  PROT 6.9 5.6*  ALBUMIN 3.7 3.0*   CBG: No results for input(s): GLUCAP in the last 168 hours.  Discharge time spent: 38 minutes.   Signed: Kathlen Mody, MD [facility] 11/04/2021

## 2021-11-07 LAB — HSV DNA BY PCR (REFERENCE LAB)
HSV 1 DNA: NEGATIVE
HSV 2 DNA: NEGATIVE

## 2021-11-09 ENCOUNTER — Telehealth: Payer: Self-pay

## 2021-11-09 NOTE — Telephone Encounter (Signed)
-----   Message from Missy Sabins, RN sent at 11/02/2021 12:00 PM EDT ----- Regarding: Labs Hepatic function panel - the order is in epic

## 2021-11-09 NOTE — Telephone Encounter (Signed)
Attempted to reach pt twice. Her phone goes straight to vm. Her mailbox is full at this time, unable to leave a message at this time. Will attempt to reach pt at a different time.   Pt uses MyChart. Will send MyChart message with lab reminder as well.

## 2021-11-15 NOTE — Telephone Encounter (Signed)
Attempted to reach patient. Her vm is full and I am unable to leave a message at this time. Will attempt to reach patient again at a different time.

## 2021-11-16 NOTE — Telephone Encounter (Signed)
Letter mailed to patient with lab reminder.

## 2021-11-22 IMAGING — US US PELVIS COMPLETE TRANSABD/TRANSVAG W DUPLEX
1 series · 15 of 25 positions shown · non-contrast
Comparison: None.

CLINICAL DATA: Pelvic pain

EXAM:
TRANSABDOMINAL AND TRANSVAGINAL ULTRASOUND OF PELVIS
DOPPLER ULTRASOUND OF OVARIES
TECHNIQUE: Both transabdominal and transvaginal ultrasound examinations of the
pelvis were performed. Transabdominal technique was performed for
global imaging of the pelvis including uterus, ovaries, adnexal
regions, and pelvic cul-de-sac.
It was necessary to proceed with endovaginal exam following the
transabdominal exam to visualize the uterus, endometrium, ovaries
and adnexa. Color and duplex Doppler ultrasound was utilized to
evaluate blood flow to the ovaries.

[Series 1: us pelvis complete transabd/transvag w duplex · 15 of 78 slices shown]
[im 1/78]
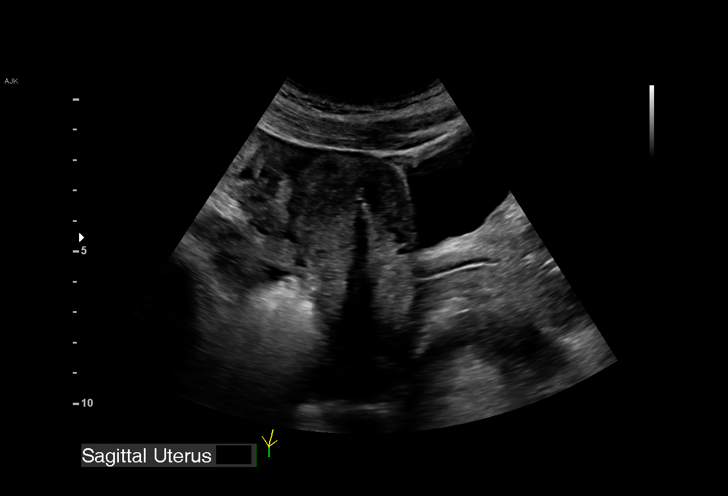
[im 7/78]
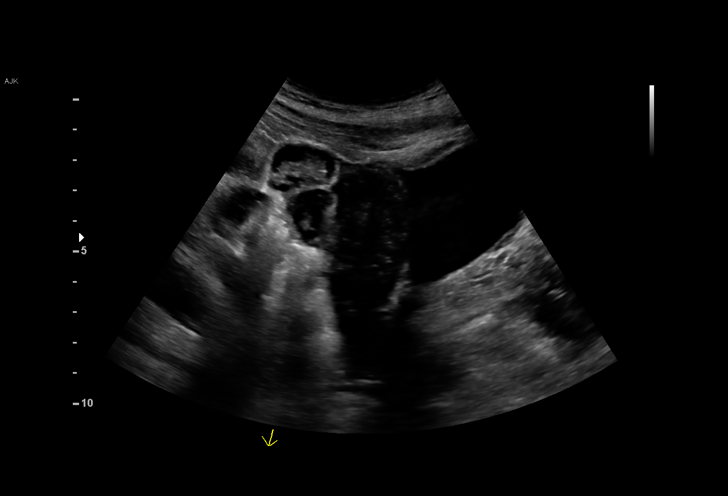
[im 13/78]
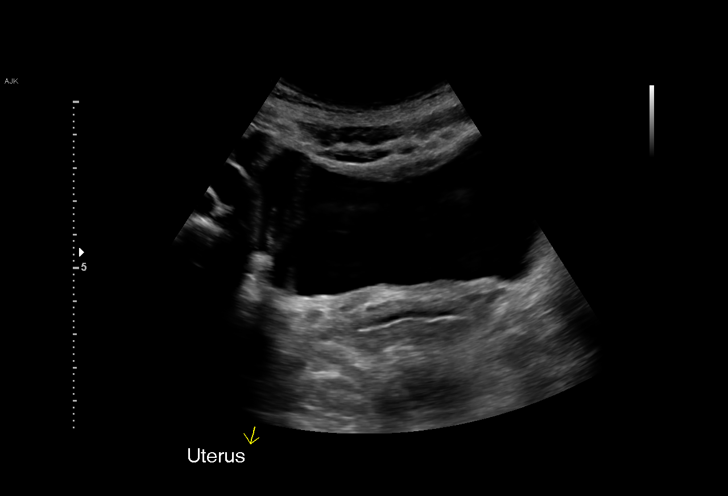
[im 17/78]
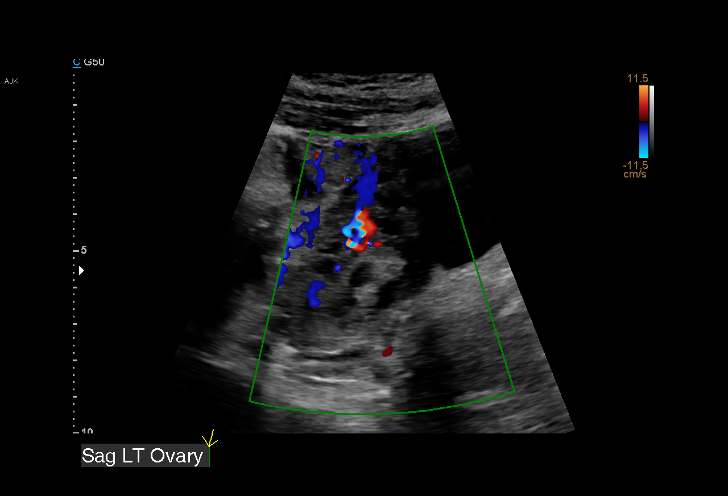
[im 23/78]
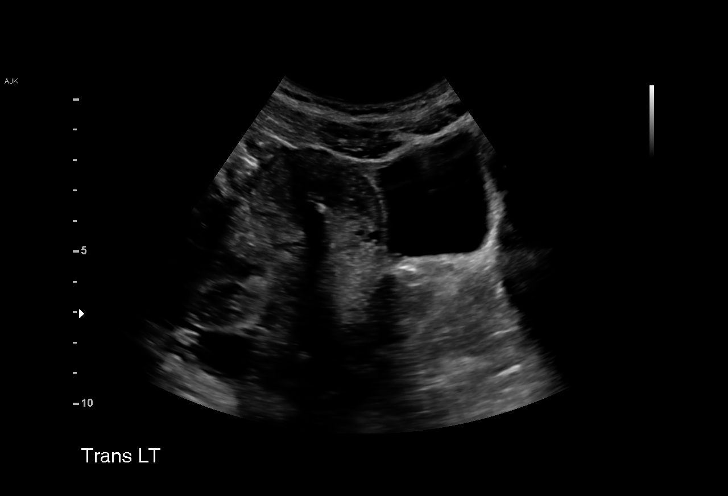
[im 29/78]
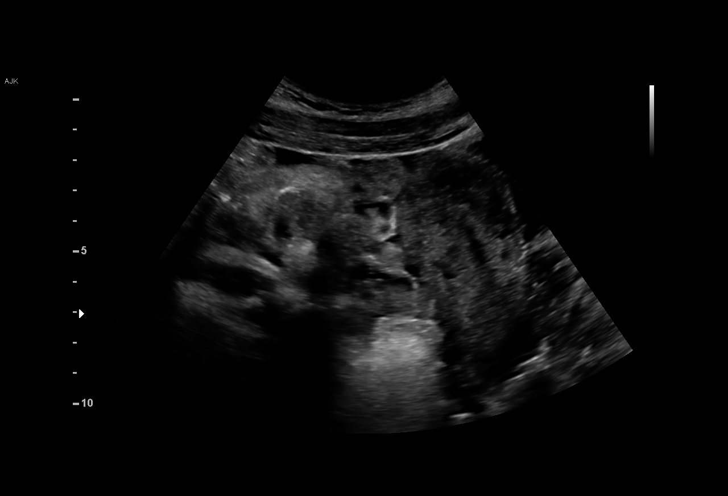
[im 33/78]
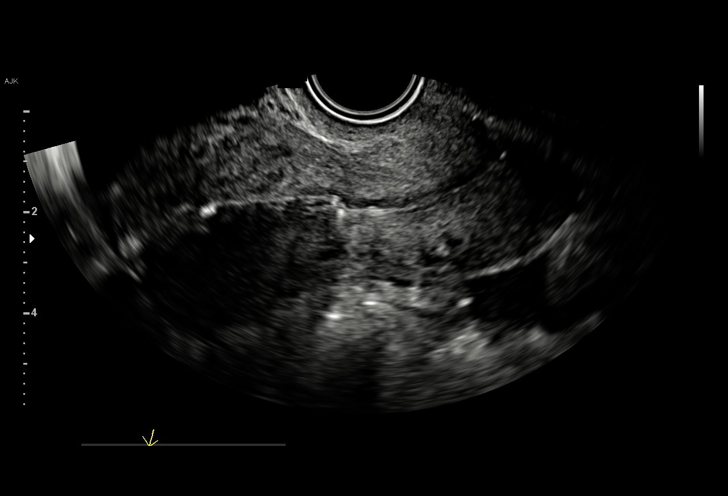
[im 39/78]
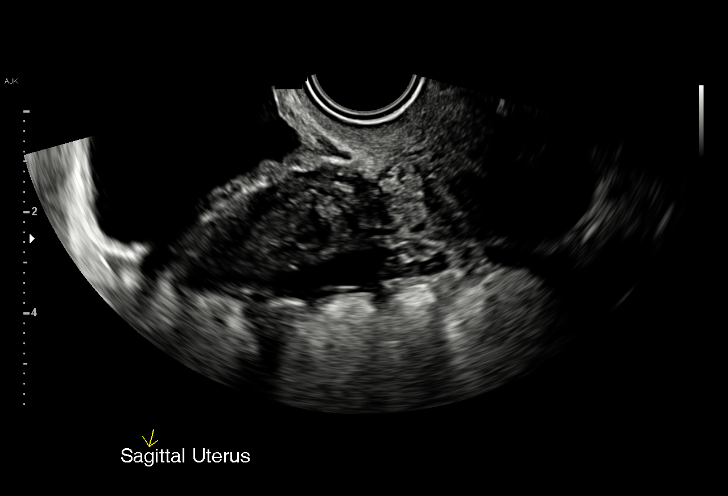
[im 45/78]
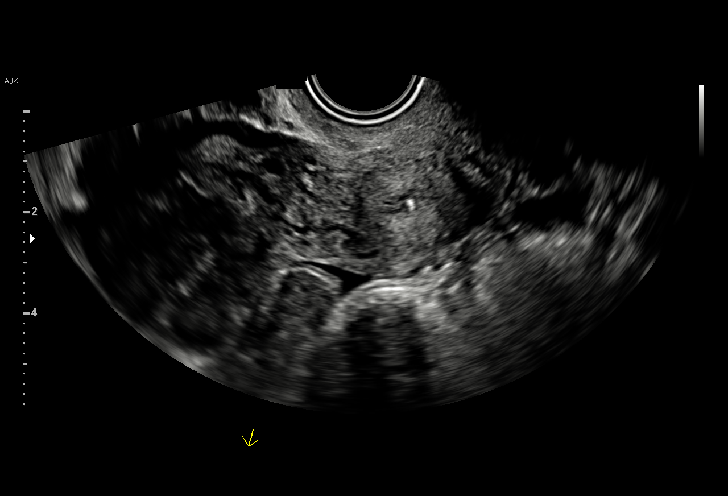
[im 49/78]
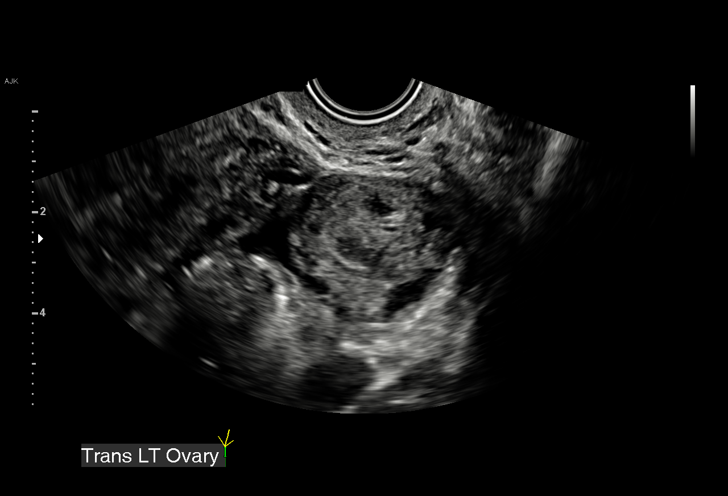
[im 55/78]
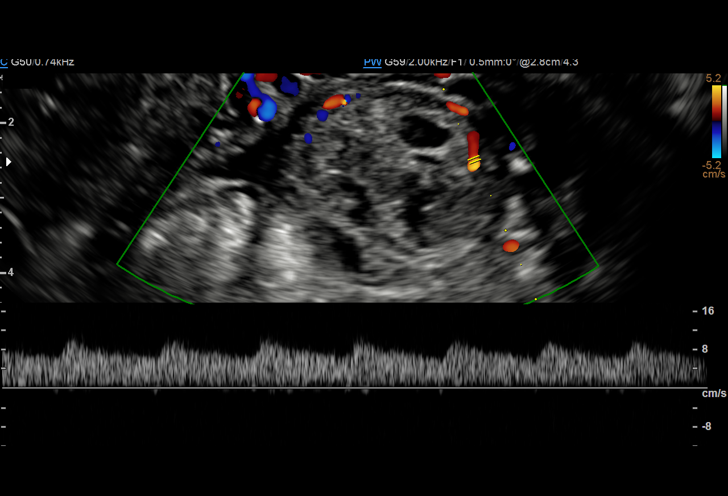
[im 61/78]
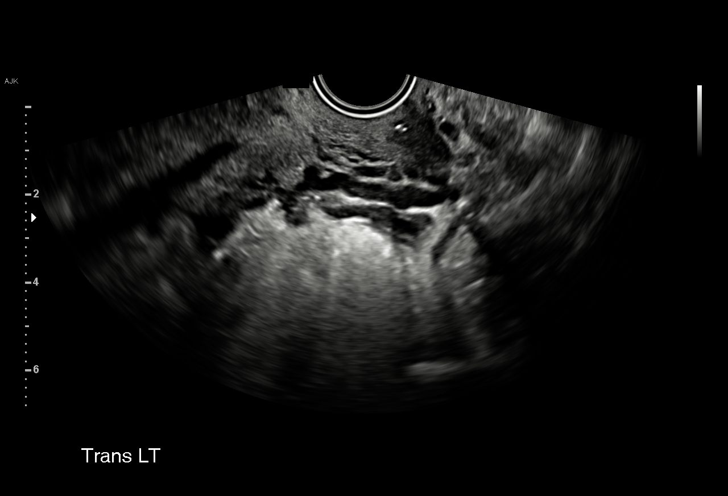
[im 65/78]
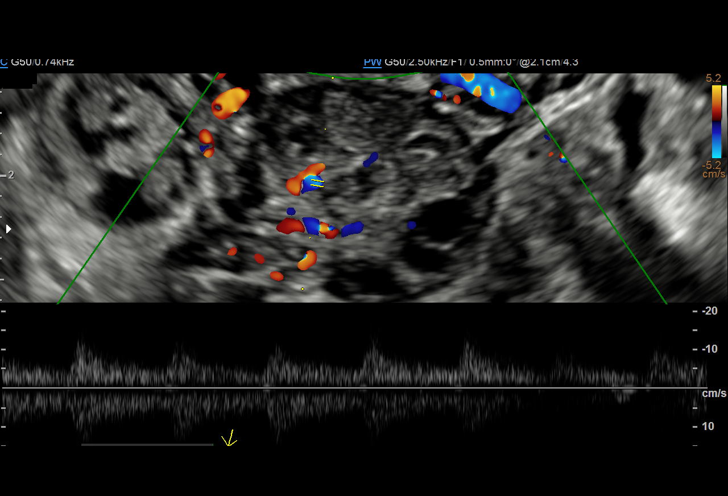
[im 71/78]
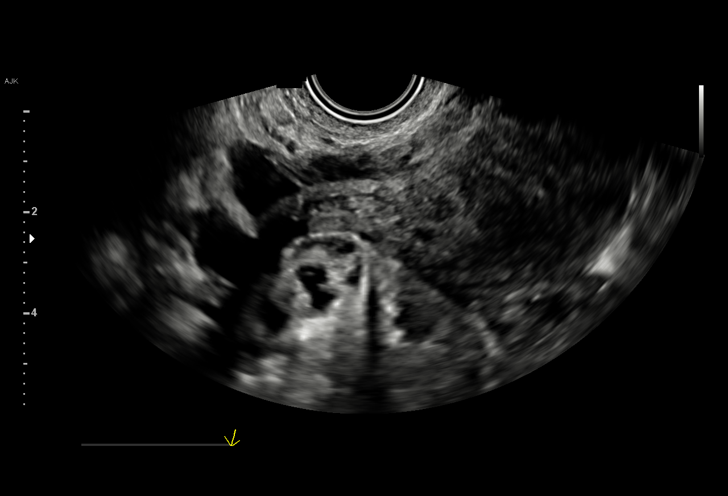
[im 78/78]
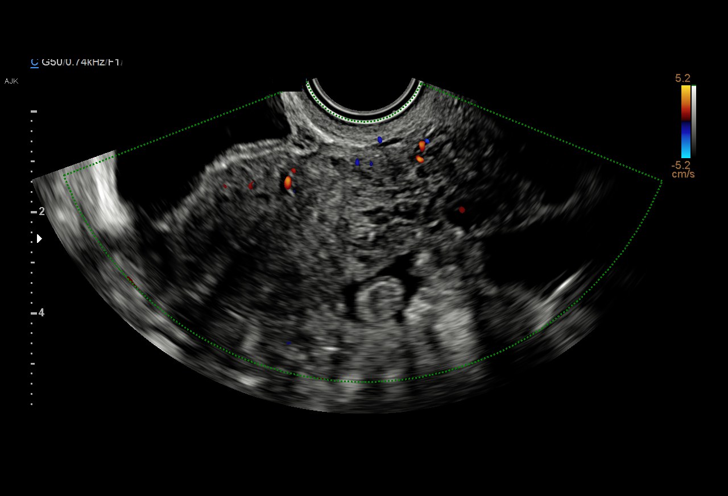

[15 of 25 positions shown; findings below may reference images not displayed]

FINDINGS: Uterus

Measurements: 9.0 x 4.2 x 5.4 cm = volume: 106 mL. No fibroids or
other mass visualized.

Endometrium

Thickness: Difficult to measure due to IUD in place. Endometrium is
thin. No focal abnormality visualized.

Right ovary

Measurements: 2.6 x 2.3 x 3.4 cm = volume: 10.5 mL. Normal
appearance/no adnexal mass.

Left ovary

Measurements: 4.6 x 3.0 x 3.7 cm = volume: 27 mL. Normal
appearance/no adnexal mass.

Pulsed Doppler evaluation of both ovaries demonstrates normal
low-resistance arterial and venous waveforms.

Other findings

No abnormal free fluid.
IMPRESSION: Normal study.  IUD in place.

## 2021-12-04 ENCOUNTER — Inpatient Hospital Stay: Payer: Self-pay | Admitting: Family Medicine

## 2022-05-22 ENCOUNTER — Encounter (HOSPITAL_BASED_OUTPATIENT_CLINIC_OR_DEPARTMENT_OTHER): Payer: Self-pay

## 2022-05-22 ENCOUNTER — Emergency Department (HOSPITAL_BASED_OUTPATIENT_CLINIC_OR_DEPARTMENT_OTHER): Payer: Self-pay

## 2022-05-22 ENCOUNTER — Emergency Department (HOSPITAL_BASED_OUTPATIENT_CLINIC_OR_DEPARTMENT_OTHER)
Admission: EM | Admit: 2022-05-22 | Discharge: 2022-05-22 | Disposition: A | Discharge status: Home / Self Care | Payer: Self-pay | Attending: Emergency Medicine | Admitting: Emergency Medicine

## 2022-05-22 DIAGNOSIS — Z1152 Encounter for screening for COVID-19: Secondary | ICD-10-CM | POA: Insufficient documentation

## 2022-05-22 DIAGNOSIS — J111 Influenza due to unidentified influenza virus with other respiratory manifestations: Secondary | ICD-10-CM | POA: Insufficient documentation

## 2022-05-22 LAB — URINALYSIS, ROUTINE W REFLEX MICROSCOPIC
Glucose, UA: NEGATIVE mg/dL
Ketones, ur: 40 mg/dL — AB
Leukocytes,Ua: NEGATIVE
Nitrite: NEGATIVE
Protein, ur: 100 mg/dL — AB
Specific Gravity, Urine: >=1.03 (ref 1.005–1.030)
pH: 6 (ref 5.0–8.0)

## 2022-05-22 LAB — COMPREHENSIVE METABOLIC PANEL
ALT: 19 U/L (ref 0–44)
AST: 30 U/L (ref 15–41)
Albumin: 4.1 g/dL (ref 3.5–5.0)
Alkaline Phosphatase: 52 U/L (ref 38–126)
Anion gap: 9 (ref 5–15)
BUN: 9 mg/dL (ref 6–20)
CO2: 26 mmol/L (ref 22–32)
Calcium: 9.1 mg/dL (ref 8.9–10.3)
Chloride: 100 mmol/L (ref 98–111)
Creatinine, Ser: 0.77 mg/dL (ref 0.44–1.00)
GFR, Estimated: >60 mL/min (ref >60)
Glucose, Bld: 112 mg/dL — ABNORMAL HIGH (ref 70–99)
Potassium: 3.3 mmol/L — ABNORMAL LOW (ref 3.5–5.1)
Sodium: 135 mmol/L (ref 135–145)
Total Bilirubin: 0.3 mg/dL (ref 0.3–1.2)
Total Protein: 8.2 g/dL — ABNORMAL HIGH (ref 6.5–8.1)

## 2022-05-22 LAB — CBC
HCT: 41.9 % (ref 36.0–46.0)
Hemoglobin: 13.3 g/dL (ref 12.0–15.0)
MCH: 23.4 pg — ABNORMAL LOW (ref 26.0–34.0)
MCHC: 31.7 g/dL (ref 30.0–36.0)
MCV: 73.6 fL — ABNORMAL LOW (ref 80.0–100.0)
Platelets: 206 10*3/uL (ref 150–400)
RBC: 5.69 MIL/uL — ABNORMAL HIGH (ref 3.87–5.11)
RDW: 13.6 % (ref 11.5–15.5)
WBC: 5.2 10*3/uL (ref 4.0–10.5)
nRBC: 0 % (ref 0.0–0.2)

## 2022-05-22 LAB — RESP PANEL BY RT-PCR (FLU A&B, COVID) ARPGX2
Influenza A by PCR: NEGATIVE
Influenza B by PCR: POSITIVE — AB
SARS Coronavirus 2 by RT PCR: NEGATIVE

## 2022-05-22 LAB — URINALYSIS, MICROSCOPIC (REFLEX)

## 2022-05-22 LAB — LIPASE, BLOOD: Lipase: 30 U/L (ref 11–51)

## 2022-05-22 LAB — PREGNANCY, URINE: Preg Test, Ur: NEGATIVE

## 2022-05-22 MED ORDER — ONDANSETRON 4 MG PO TBDP
4.0000 mg | ORAL_TABLET | Freq: Once | ORAL | Status: AC
  Administered 2022-05-22: 4 mg via ORAL
  Filled 2022-05-22: qty 1

## 2022-05-22 MED ORDER — METOCLOPRAMIDE HCL 5 MG/ML IJ SOLN: 10.0000 mg | Freq: Once | INTRAMUSCULAR | Status: DC

## 2022-05-22 MED ORDER — ONDANSETRON HCL 4 MG/2ML IJ SOLN
4.0000 mg | Freq: Once | INTRAMUSCULAR | Status: AC
  Administered 2022-05-22: 4 mg via INTRAVENOUS
  Filled 2022-05-22: qty 2

## 2022-05-22 MED ORDER — SODIUM CHLORIDE 0.9 % IV BOLUS
1000.0000 mL | Freq: Once | INTRAVENOUS | Status: AC
  Administered 2022-05-22: 1000 mL via INTRAVENOUS

## 2022-05-22 MED ORDER — ONDANSETRON HCL 4 MG PO TABS: 4.0000 mg | ORAL_TABLET | Freq: Four times a day (QID) | ORAL | 0 refills | Status: AC | PRN

## 2022-05-22 NOTE — ED Triage Notes (Signed)
Pt c/o "sick since Thursday, unable to eat/drink since." C/o cough, "fire in chest," NVD x2 days, poor PO

## 2022-05-22 NOTE — ED Provider Notes (Signed)
[facility] EMERGENCY DEPARTMENT Provider Note   CSN: [id number] Arrival date & time: 05/22/22  1036     History  Chief Complaint  Patient presents with   Nausea   Emesis   Diarrhea    Olivia Morton is a 31 y.o. female with no relevant medical history.  Patient presents to ED for evaluation of URI symptoms.  Patient reports that on Thursday last week she developed body aches, chills, fever, nausea, vomiting, diarrhea.  Patient reports that the symptoms have progressively worsened over the course the last couple of days.  Patient reports she has been taking DayQuil, NyQuil, ibuprofen at home which does mildly relieve symptoms however symptoms seem to always return.  Patient states that she has had multiple sets of nausea and vomiting since Thursday.  Patient denies any vaginal discharge, dysuria, abdominal pain, flank pain.   Emesis Associated symptoms: chills, diarrhea, fever and myalgias   Associated symptoms: no abdominal pain   Diarrhea Associated symptoms: chills, fever, myalgias and vomiting   Associated symptoms: no abdominal pain        Home Medications Prior to Admission medications   Medication Sig Start Date End Date Taking? Authorizing Provider  ondansetron (ZOFRAN) 4 MG tablet Take 1 tablet (4 mg total) by mouth every 6 (six) hours as needed for nausea or vomiting. 05/22/22  Yes Al Decant, PA-C  acetaminophen (TYLENOL) 500 MG tablet Take 1,000 mg by mouth every 6 (six) hours as needed for moderate pain or headache.    [provider]      Allergies    Clindamycin/lincomycin and Grapeseed extract [nutritional supplements]    Review of Systems   Review of Systems  Constitutional:  Positive for chills and fever.  Gastrointestinal:  Positive for diarrhea, nausea and vomiting. Negative for abdominal pain.  Genitourinary:  Negative for dysuria, flank pain and vaginal discharge.  Musculoskeletal:  Positive for myalgias.  All other systems  reviewed and are negative.   Physical Exam Updated Vital Signs BP 121/83   Pulse 85   Temp 98.2 F (36.8 C) (Oral)   Resp 16   Ht 5\' 7"  (1.702 m)   Wt 77.1 kg   SpO2 98%   BMI 26.63 kg/m  Physical Exam Vitals and nursing note reviewed.  Constitutional:      General: She is not in acute distress.    Appearance: She is well-developed.  HENT:     Head: Normocephalic and atraumatic.     Mouth/Throat:     Mouth: Mucous membranes are moist.     Pharynx: Oropharynx is clear.  Eyes:     Conjunctiva/sclera: Conjunctivae normal.  Cardiovascular:     Rate and Rhythm: Normal rate and regular rhythm.     Heart sounds: No murmur heard. Pulmonary:     Effort: Pulmonary effort is normal. No respiratory distress.     Breath sounds: Normal breath sounds.  Abdominal:     General: Abdomen is flat. Bowel sounds are normal.     Palpations: Abdomen is soft.     Tenderness: There is no abdominal tenderness.  Musculoskeletal:        General: No swelling.     Cervical back: Neck supple.  Skin:    General: Skin is warm and dry.     Capillary Refill: Capillary refill takes less than 2 seconds.  Neurological:     Mental Status: She is alert and oriented to person, place, and time.  Psychiatric:  Mood and Affect: Mood normal.     ED Results / Procedures / Treatments   Labs (all labs ordered are listed, but only abnormal results are displayed) Labs Reviewed  RESP PANEL BY RT-PCR (FLU A&B, COVID) ARPGX2 - Abnormal; Notable for the following components:      Result Value   Influenza B by PCR POSITIVE (*)    All other components within normal limits  COMPREHENSIVE METABOLIC PANEL - Abnormal; Notable for the following components:   Potassium 3.3 (*)    Glucose, Bld 112 (*)    Total Protein 8.2 (*)    All other components within normal limits  CBC - Abnormal; Notable for the following components:   RBC 5.69 (*)    MCV 73.6 (*)    MCH 23.4 (*)    All other components within normal  limits  URINALYSIS, ROUTINE W REFLEX MICROSCOPIC - Abnormal; Notable for the following components:   APPearance HAZY (*)    Hgb urine dipstick SMALL (*)    Bilirubin Urine SMALL (*)    Ketones, ur 40 (*)    Protein, ur 100 (*)    All other components within normal limits  URINALYSIS, MICROSCOPIC (REFLEX) - Abnormal; Notable for the following components:   Bacteria, UA RARE (*)    All other components within normal limits  GROUP A STREP BY PCR  LIPASE, BLOOD  PREGNANCY, URINE    EKG None  Radiology DG Chest Portable 1 View  Result Date: 05/22/2022 CLINICAL DATA:  Cough EXAM: PORTABLE CHEST 1 VIEW COMPARISON:  12/03/2019 FINDINGS: The heart size and mediastinal contours are within normal limits. Both lungs are clear. The visualized skeletal structures are unremarkable. IMPRESSION: No acute abnormality of the lungs. Electronically Signed   By: Jearld Lesch M.D.   On: 05/22/2022 11:28    Procedures Procedures   Medications Ordered in ED Medications  ondansetron (ZOFRAN-ODT) disintegrating tablet 4 mg (4 mg Oral Given 05/22/22 1059)  sodium chloride 0.9 % bolus 1,000 mL (1,000 mLs Intravenous New Bag/Given 05/22/22 1205)  ondansetron (ZOFRAN) injection 4 mg (4 mg Intravenous Given 05/22/22 1203)    ED Course/ Medical Decision Making/ A&P                           Medical Decision Making Amount and/or Complexity of Data Reviewed Labs: ordered. Radiology: ordered.  Risk Prescription drug management.   31 year old female presents to ED for evaluation.  Please see HPI for further details.  On examination patient afebrile nontachycardic.  Patient sounds clear bilaterally, she is not hypoxic.  Patient abdomen is soft and compressible throughout.  The patient neurological examination shows no focal neurodeficits.  Patient work-up initiated in triage includes CBC, CMP, lipase, viral panel, urinalysis, pregnancy urine, chest x-ray.  CMP shows decreased potassium to 3.3.  Patient  lipase unremarkable.  Patient CBC without leukocytosis or anemia.  The patient urinalysis shows small hemoglobin, bilirubin, ketones.  Patient pregnancy test negative.  Patient chest x-ray shows no consolidations or effusions.  Patient viral panel is positive for influenza B.  Most likely cause of patient's symptoms due to this.  Patient given IV fluid bolus, 4 mg oral Zofran.  Patient reports that nausea persists after oral Zofran so IV Zofran was ministered at this time.  Patient is show no vomiting while she has been here in the department.  At this time, the patient stable for discharge.  The patient will be advised to continue to  treat symptoms at home.  Patient will be sent home with Zofran.  Patient encouraged to push fluids, eat low-fat high-protein diet.  Patient given return precautions and she voiced understanding.  The patient had all her questions answered to her satisfaction.  The patient is stable at discharge.  Final Clinical Impression(s) / ED Diagnoses Final diagnoses:  Influenza    Rx / DC Orders ED Discharge Orders          Ordered    ondansetron (ZOFRAN) 4 MG tablet  Every 6 hours PRN        05/22/22 1311              Al Decant, PA-C 05/22/22 1319    Virgina Norfolk, DO 05/22/22 1436

## 2022-05-22 NOTE — Discharge Instructions (Addendum)
Please return to the ED with any new or worsening signs or symptoms Please read attached guide concerning influenza Please continue treating symptoms at home.  Please take ibuprofen for body aches and chills.  Please take Tylenol for headaches and fevers. Please take Zofran for nausea Patient any pushing fluids to include Body Armor, Pedialyte Please eat low-fat high-protein diet Please see attached work note

## 2022-05-22 NOTE — ED Notes (Signed)
Pt up to BR , has very congested cough that makes her nauseaous

## 2022-10-04 ENCOUNTER — Emergency Department (HOSPITAL_BASED_OUTPATIENT_CLINIC_OR_DEPARTMENT_OTHER)
Admission: EM | Admit: 2022-10-04 | Discharge: 2022-10-04 | Disposition: A | Discharge status: Home / Self Care | Payer: Self-pay | Attending: Emergency Medicine | Admitting: Emergency Medicine

## 2022-10-04 ENCOUNTER — Other Ambulatory Visit: Payer: Self-pay

## 2022-10-04 ENCOUNTER — Encounter (HOSPITAL_BASED_OUTPATIENT_CLINIC_OR_DEPARTMENT_OTHER): Payer: Self-pay | Admitting: Urology

## 2022-10-04 ENCOUNTER — Emergency Department (HOSPITAL_BASED_OUTPATIENT_CLINIC_OR_DEPARTMENT_OTHER): Payer: Self-pay

## 2022-10-04 DIAGNOSIS — R079 Chest pain, unspecified: Secondary | ICD-10-CM | POA: Insufficient documentation

## 2022-10-04 LAB — CBC
HCT: 37.3 % (ref 36.0–46.0)
Hemoglobin: 11.8 g/dL — ABNORMAL LOW (ref 12.0–15.0)
MCH: 23.7 pg — ABNORMAL LOW (ref 26.0–34.0)
MCHC: 31.6 g/dL (ref 30.0–36.0)
MCV: 75.1 fL — ABNORMAL LOW (ref 80.0–100.0)
Platelets: 266 10*3/uL (ref 150–400)
RBC: 4.97 MIL/uL (ref 3.87–5.11)
RDW: 13.5 % (ref 11.5–15.5)
WBC: 6.1 10*3/uL (ref 4.0–10.5)
nRBC: 0 % (ref 0.0–0.2)

## 2022-10-04 LAB — BASIC METABOLIC PANEL
Anion gap: 7 (ref 5–15)
BUN: 12 mg/dL (ref 6–20)
CO2: 24 mmol/L (ref 22–32)
Calcium: 8.9 mg/dL (ref 8.9–10.3)
Chloride: 104 mmol/L (ref 98–111)
Creatinine, Ser: 0.76 mg/dL (ref 0.44–1.00)
GFR, Estimated: >60 mL/min (ref >60)
Glucose, Bld: 99 mg/dL (ref 70–99)
Potassium: 3.9 mmol/L (ref 3.5–5.1)
Sodium: 135 mmol/L (ref 135–145)

## 2022-10-04 LAB — TROPONIN I (HIGH SENSITIVITY): Troponin I (High Sensitivity): <2 ng/L (ref <18)

## 2022-10-04 NOTE — Discharge Instructions (Addendum)
The cause of your chest pain is unknown, please make sure you are drinking lots of fluids, and resting.  Please follow-up with a primary care doctor, if you do not have a primary care doctor, please make an appointment with 1 based off the number below.  They will help arrange for you to have a primary care doctor.  If you have worsening chest pain, shortness of breath return to the ER.

## 2022-10-04 NOTE — ED Triage Notes (Signed)
Pt states over the past 4 weeks has had intermittent chest pain Was sitting at work today and "felt like someone punched me in the chest at 2130 and then at 2140"  States pain in center of chest  Denies SOB, and also states neck feels stiff

## 2022-10-04 NOTE — ED Provider Notes (Signed)
[place] [facility] AT [facility] Provider Note   CSN: [id number] Arrival date & time: 10/04/22  2204     History  Chief Complaint  Patient presents with   Chest Pain    Olivia Morton is a 32 y.o. female, hx of cholecystectomy, who presents to the ED secondary to intermittent chest pain, has been going on for the last 4 weeks.  She states that it is random in nature, not associated with food, or walking.  States it just comes and goes. Notes it's been more mild but today was severe and stopped her in her tracks. She states it was severe tonight and lasted for a minute and then stopped, then came back. Denies any SOB, abdominal pain, nausea or vomiting.     Home Medications Prior to Admission medications   Medication Sig Start Date End Date Taking? Authorizing Provider  acetaminophen (TYLENOL) 500 MG tablet Take 1,000 mg by mouth every 6 (six) hours as needed for moderate pain or headache.    [provider]  ondansetron (ZOFRAN) 4 MG tablet Take 1 tablet (4 mg total) by mouth every 6 (six) hours as needed for nausea or vomiting. 05/22/22   Al Decant, PA-C      Allergies    Clindamycin/lincomycin and Grapeseed extract [nutritional supplements]    Review of Systems   Review of Systems  Respiratory:  Negative for shortness of breath.   Cardiovascular:  Positive for chest pain.    Physical Exam Updated Vital Signs BP 128/80 (BP Location: Left Arm)   Pulse 81   Temp 98.2 F (36.8 C) (Oral)   Resp 18   Ht  (1.702 m)   Wt 76.7 kg   LMP 09/13/2022   SpO2 100%   BMI 26.47 kg/m  Physical Exam Vitals and nursing note reviewed.  Constitutional:      General: She is not in acute distress.    Appearance: She is well-developed.  HENT:     Head: Normocephalic and atraumatic.  Eyes:     Conjunctiva/sclera: Conjunctivae normal.  Cardiovascular:     Rate and Rhythm: Normal rate and regular rhythm.     Heart sounds: No murmur  heard. Pulmonary:     Effort: Pulmonary effort is normal. No respiratory distress.     Breath sounds: Normal breath sounds.  Abdominal:     Palpations: Abdomen is soft.     Tenderness: There is no abdominal tenderness.  Musculoskeletal:        General: No swelling.     Cervical back: Neck supple.  Skin:    General: Skin is warm and dry.     Capillary Refill: Capillary refill takes less than 2 seconds.  Neurological:     Mental Status: She is alert.  Psychiatric:        Mood and Affect: Mood normal.     ED Results / Procedures / Treatments   Labs (all labs ordered are listed, but only abnormal results are displayed) Labs Reviewed  CBC - Abnormal; Notable for the following components:      Result Value   Hemoglobin 11.8 (*)    MCV 75.1 (*)    MCH 23.7 (*)    All other components within normal limits  BASIC METABOLIC PANEL  PREGNANCY, URINE  TROPONIN I (HIGH SENSITIVITY)    EKG None  Radiology DG Chest 2 View  Result Date: 10/04/2022 CLINICAL DATA:  Chest pain EXAM: CHEST - 2 VIEW COMPARISON:  05/22/2022 FINDINGS:  Lungs are clear.  No pleural effusion or pneumothorax. The heart is normal in size. Visualized osseous structures are within normal limits. IMPRESSION: Normal chest radiographs. Electronically Signed   By: Charline Bills M.D.   On: 10/04/2022 22:27    Procedures Procedures    Medications Ordered in ED Medications - No data to display  ED Course/ Medical Decision Making/ A&P                             Medical Decision Making Patient is a 32 year old female, who is overall well-appearing, she states she is here for chest pain has been intermittent for the last 4 weeks, and now worse today.  She denies any shortness of breath nausea, vomiting, abdominal pain associated with this.  Pain is in the center of her chest.  She denies any current chest pain.  It resolves after a minute, and then comes back.  She states that she did go through a break-up in  December, but has been doing well with her new boyfriend.  We will obtain troponins, chest x-ray, and labs for further evaluation.  She is well-appearing at this time.  No use of OCPs, nontachycardic.  Amount and/or Complexity of Data Reviewed Labs: ordered.    Details: Troponin within normal limit, no evidence of leukocytosis Radiology: ordered.    Details: Chest x-ray clear ECG/medicine tests:     Details: Normal sinus rhythm Discussion of management or test interpretation with external provider(s): Heart score of 1.  Discussed with patient, her labs are reassuring troponin within normal limits, she does not have any abdominal pain and she has a history of cholecystectomy.  This may be related to her anxiety,?,  It is unclear at this time.  We discussed follow-up with primary care doctor, and she voiced understanding.  She does not appear to be in any acute distress, has low risk factors.    Final Clinical Impression(s) / ED Diagnoses Final diagnoses:  Chest pain, unspecified type    Rx / DC Orders ED Discharge Orders     None         Quorra Rosene, Harley Alto, PA 10/04/22 2326    Maia Plan, MD 10/05/22 1520

## 2022-10-16 ENCOUNTER — Encounter (HOSPITAL_BASED_OUTPATIENT_CLINIC_OR_DEPARTMENT_OTHER): Payer: Self-pay | Admitting: Pediatrics

## 2022-10-16 ENCOUNTER — Emergency Department (HOSPITAL_BASED_OUTPATIENT_CLINIC_OR_DEPARTMENT_OTHER)
Admission: EM | Admit: 2022-10-16 | Discharge: 2022-10-16 | Disposition: A | Discharge status: Home / Self Care | Payer: Commercial Managed Care - PPO | Attending: Emergency Medicine | Admitting: Emergency Medicine

## 2022-10-16 ENCOUNTER — Other Ambulatory Visit: Payer: Self-pay

## 2022-10-16 DIAGNOSIS — Z1152 Encounter for screening for COVID-19: Secondary | ICD-10-CM | POA: Insufficient documentation

## 2022-10-16 DIAGNOSIS — J029 Acute pharyngitis, unspecified: Secondary | ICD-10-CM | POA: Diagnosis present

## 2022-10-16 DIAGNOSIS — B9789 Other viral agents as the cause of diseases classified elsewhere: Secondary | ICD-10-CM

## 2022-10-16 DIAGNOSIS — J039 Acute tonsillitis, unspecified: Secondary | ICD-10-CM | POA: Diagnosis not present

## 2022-10-16 LAB — RESP PANEL BY RT-PCR (RSV, FLU A&B, COVID)  RVPGX2
Influenza A by PCR: NEGATIVE
Influenza B by PCR: NEGATIVE
Resp Syncytial Virus by PCR: NEGATIVE
SARS Coronavirus 2 by RT PCR: NEGATIVE

## 2022-10-16 LAB — GROUP A STREP BY PCR: Group A Strep by PCR: NOT DETECTED

## 2022-10-16 MED ORDER — IBUPROFEN 800 MG PO TABS
800.0000 mg | ORAL_TABLET | Freq: Once | ORAL | Status: AC
  Administered 2022-10-16: 800 mg via ORAL
  Filled 2022-10-16: qty 1

## 2022-10-16 MED ORDER — DEXAMETHASONE 4 MG PO TABS
4.0000 mg | ORAL_TABLET | Freq: Once | ORAL | Status: AC
  Administered 2022-10-16: 4 mg via ORAL
  Filled 2022-10-16: qty 1

## 2022-10-16 MED ORDER — BENZONATATE 100 MG PO CAPS: 100.0000 mg | ORAL_CAPSULE | Freq: Three times a day (TID) | ORAL | 0 refills | Status: AC

## 2022-10-16 NOTE — ED Triage Notes (Signed)
C/O sore throat, runny nose, cough started Saturday,

## 2022-10-16 NOTE — Discharge Instructions (Addendum)
Your work-up in the ER today was reassuring for acute findings. You were swabbed for COVID, flu, RSV, and strep which were negative.  Your symptoms are likely due to tonsillitis which is likely viral. I recommend that you get plenty of rest and focus on symptomatic relief which includes Cepacol throat lozenges for sore throat, Mucinex D (orange box) which you can get from behind the counter at your local pharmacy for congestion, and tylenol/ibuprofen as needed for fevers and bodyaches. I have also given you a prescription for Tessalon which is a cough suppressant medication for you to take as prescribed as needed for management of your symptoms. I also recommend:  Increased fluid intake. Sports drinks offer valuable electrolytes, sugars, and fluids.  Breathing heated mist or steam (vaporizer or shower).  Eating chicken soup or other clear broths, and maintaining good nutrition.   Increasing usage of your inhaler if you have asthma.  Return to work when your temperature has returned to normal.  Gargle warm salt water and spit it out for sore throat. Take benadryl or Zyrtec to decrease sinus secretions.  Follow Up: Follow up with your primary care doctor in 5-7 days for recheck of ongoing symptoms.  Return to emergency department for emergent changing or worsening of symptoms.

## 2022-10-16 NOTE — ED Provider Notes (Signed)
[place] [facility] AT [facility] Provider Note   CSN: [id number] Arrival date & time: 10/16/22  1424     History  Chief Complaint  Patient presents with   Sore Throat    Olivia Morton is a 32 y.o. female.  Patient with no pertinent past medical history presents today with complaints of cough, congestion, and sore throat.  She states that same began 3 days ago and has been persistent since then.  She denies fevers or chills.  No known sick contacts.  She is able to swallow with some discomfort.  Denies chest pain, shortness of breath, nausea, or vomiting.  She has tried over-the-counter cough and cold medication with minimal relief.  The history is provided by the patient. No language interpreter was used.  Sore Throat       Home Medications Prior to Admission medications   Medication Sig Start Date End Date Taking? Authorizing Provider  acetaminophen (TYLENOL) 500 MG tablet Take 1,000 mg by mouth every 6 (six) hours as needed for moderate pain or headache.    [provider]  ondansetron (ZOFRAN) 4 MG tablet Take 1 tablet (4 mg total) by mouth every 6 (six) hours as needed for nausea or vomiting. 05/22/22   Al Decant, PA-C      Allergies    Clindamycin/lincomycin and Grapeseed extract [nutritional supplements]    Review of Systems   Review of Systems  HENT:  Positive for congestion and sore throat.   Respiratory:  Positive for cough.   All other systems reviewed and are negative.   Physical Exam Updated Vital Signs BP (!) 137/90 (BP Location: Left Arm)   Pulse 84   Temp 98.8 F (37.1 C) (Oral)   Resp 18   Ht 5\' 7"  (1.702 m)   Wt 76.7 kg   LMP 09/06/2022   SpO2 100%   BMI 26.47 kg/m  Physical Exam Vitals and nursing note reviewed.  Constitutional:      General: She is not in acute distress.    Appearance: Normal appearance. She is normal weight. She is not ill-appearing, toxic-appearing or diaphoretic.  HENT:      Head: Normocephalic and atraumatic.     Mouth/Throat:     Mouth: Mucous membranes are moist.     Pharynx: Uvula midline. No oropharyngeal exudate or uvula swelling.     Tonsils: No tonsillar exudate or tonsillar abscesses. 2+ on the right. 2+ on the left.  Eyes:     Conjunctiva/sclera: Conjunctivae normal.  Neck:     Comments: No meningismus Cardiovascular:     Rate and Rhythm: Normal rate and regular rhythm.     Heart sounds: Normal heart sounds.  Pulmonary:     Effort: Pulmonary effort is normal. No respiratory distress.     Breath sounds: Normal breath sounds.  Abdominal:     Palpations: Abdomen is soft.     Tenderness: There is no abdominal tenderness.  Musculoskeletal:        General: Normal range of motion.     Cervical back: Normal range of motion.  Skin:    General: Skin is warm and dry.  Neurological:     General: No focal deficit present.     Mental Status: She is alert.  Psychiatric:        Mood and Affect: Mood normal.        Behavior: Behavior normal.     ED Results / Procedures / Treatments   Labs (all labs ordered  are listed, but only abnormal results are displayed) Labs Reviewed  GROUP A STREP BY PCR  RESP PANEL BY RT-PCR (RSV, FLU A&B, COVID)  RVPGX2    EKG None  Radiology No results found.  Procedures Procedures    Medications Ordered in ED Medications  dexamethasone (DECADRON) tablet 4 mg (has no administration in time range)  ibuprofen (ADVIL) tablet 800 mg (800 mg Oral Given 10/16/22 1605)    ED Course/ Medical Decision Making/ A&P                             Medical Decision Making Risk Prescription drug management.   Patient presents today with cough, congestion, and sore throat x 3 days.  She is afebrile, nontoxic-appearing, and in no acute distress with reassuring vital signs.  She is negative for COVID, flu, RSV, and strep.  Suspect her symptoms are due to tonsillitis, likely viral etiology.  Exam nonconcerning for PTA or RPA.   Given ibuprofen and decadron which she was able to swallow with some discomfort with improvement of symptoms. Evaluation and diagnostic testing in the emergency department does not suggest an emergent condition requiring admission or immediate intervention beyond what has been performed at this time.  Plan for discharge with OTC recommendations and close PCP follow-up.  Patient is understanding and amenable with plan, educated on red flag symptoms that would prompt immediate return.  Patient discharged in stable condition.   Final Clinical Impression(s) / ED Diagnoses Final diagnoses:  Viral tonsillitis    Rx / DC Orders ED Discharge Orders          Ordered    benzonatate (TESSALON) 100 MG capsule  Every 8 hours        10/16/22 1650          An After Visit Summary was printed and given to the patient.     Vear Clock 10/16/22 1651    Tegeler, Canary Brim, MD 10/16/22 2308

## 2022-10-18 ENCOUNTER — Encounter (HOSPITAL_BASED_OUTPATIENT_CLINIC_OR_DEPARTMENT_OTHER): Payer: Self-pay | Admitting: Radiology

## 2022-10-18 ENCOUNTER — Other Ambulatory Visit: Payer: Self-pay

## 2022-10-18 ENCOUNTER — Emergency Department (HOSPITAL_BASED_OUTPATIENT_CLINIC_OR_DEPARTMENT_OTHER)
Admission: EM | Admit: 2022-10-18 | Discharge: 2022-10-18 | Disposition: A | Discharge status: Home / Self Care | Payer: Commercial Managed Care - PPO | Attending: Emergency Medicine | Admitting: Emergency Medicine

## 2022-10-18 DIAGNOSIS — J029 Acute pharyngitis, unspecified: Secondary | ICD-10-CM | POA: Diagnosis present

## 2022-10-18 MED ORDER — DEXAMETHASONE SODIUM PHOSPHATE 10 MG/ML IJ SOLN
10.0000 mg | Freq: Once | INTRAMUSCULAR | Status: AC
  Administered 2022-10-18: 10 mg via INTRAMUSCULAR
  Filled 2022-10-18: qty 1

## 2022-10-18 MED ORDER — LIDOCAINE VISCOUS HCL 2 % MT SOLN
15.0000 mL | Freq: Once | OROMUCOSAL | Status: AC
  Administered 2022-10-18: 15 mL via OROMUCOSAL
  Filled 2022-10-18: qty 15

## 2022-10-18 MED ORDER — AMOXICILLIN-POT CLAVULANATE 875-125 MG PO TABS: 1.0000 | ORAL_TABLET | Freq: Two times a day (BID) | ORAL | 0 refills | Status: AC

## 2022-10-18 MED ORDER — ALUM & MAG HYDROXIDE-SIMETH 200-200-20 MG/5ML PO SUSP
30.0000 mL | Freq: Once | ORAL | Status: AC
  Administered 2022-10-18: 30 mL via ORAL
  Filled 2022-10-18: qty 30

## 2022-10-18 MED ORDER — AMOXICILLIN-POT CLAVULANATE 875-125 MG PO TABS
1.0000 | ORAL_TABLET | Freq: Once | ORAL | Status: AC
  Administered 2022-10-18: 1 via ORAL
  Filled 2022-10-18: qty 1

## 2022-10-18 NOTE — ED Provider Notes (Signed)
[facility] Emergency Department Provider Note MRN:  [id number]  Arrival date & time: 10/18/22     Chief Complaint   Sore Throat   History of Present Illness   Olivia Morton is a 32 y.o. year-old female with no pertinent past medical history presenting to the ED with chief complaint of sore throat.  Severe sore throat for the past 5 or 6 days, trouble swallowing anything.  Was told it was a virus.  No shortness of breath, no other complaints.  Review of Systems  A thorough review of systems was obtained and all systems are negative except as noted in the HPI and PMH.   Patient's Health History    Past Medical History:  Diagnosis Date   PID (acute pelvic inflammatory disease) 2020    Past Surgical History:  Procedure Laterality Date   BREAST SURGERY     Abscess to left breast   CHOLECYSTECTOMY      Family History  Problem Relation Age of Onset   Hypertension Mother    Hypertension Sister     Social History   Socioeconomic History   Marital status: Single    Spouse name: Not on file   Number of children: Not on file   Years of education: Not on file   Highest education level: Not on file  Occupational History   Not on file  Tobacco Use   Smoking status: Never   Smokeless tobacco: Never  Vaping Use   Vaping Use: Never used  Substance and Sexual Activity   Alcohol use: Yes    Comment: Ocassionally   Drug use: Yes    Types: Marijuana    Comment: occasional   Sexual activity: Yes    Birth control/protection: I.U.D.    Comment: Males  Other Topics Concern   Not on file  Social History Narrative   Not on file   Social Determinants of Health   Financial Resource Strain: Not on file  Food Insecurity: Not on file  Transportation Needs: Not on file  Physical Activity: Not on file  Stress: Not on file  Social Connections: Not on file  Intimate Partner Violence: Not on file     Physical Exam   Vitals:   10/18/22 0303 10/18/22  0306  BP:  124/82  Pulse:  79  Resp:  17  Temp:  97.9 F (36.6 C)  SpO2: 98% 99%    CONSTITUTIONAL: Well-appearing, NAD NEURO/PSYCH:  Alert and oriented x 3, no focal deficits EYES:  eyes equal and reactive ENT/NECK:  no LAD, no JVD CARDIO: Regular rate, well-perfused, normal S1 and S2 PULM:  CTAB no wheezing or rhonchi GI/GU:  non-distended, non-tender MSK/SPINE:  No gross deformities, no edema SKIN:  no rash, atraumatic   *Additional and/or pertinent findings included in MDM below  Diagnostic and Interventional Summary    EKG Interpretation  Date/Time:    Ventricular Rate:    PR Interval:    QRS Duration:   QT Interval:    QTC Calculation:   R Axis:     Text Interpretation:         Labs Reviewed - No data to display  No orders to display    Medications  dexamethasone (DECADRON) injection 10 mg (has no administration in time range)  amoxicillin-clavulanate (AUGMENTIN) 875-125 MG per tablet 1 tablet (has no administration in time range)  lidocaine (XYLOCAINE) 2 % viscous mouth solution 15 mL (has no administration in time range)  alum & mag hydroxide-simeth (  MAALOX/MYLANTA) 200-200-20 MG/5ML suspension 30 mL (has no administration in time range)     Procedures  /  Critical Care Procedures  ED Course and Medical Decision Making  Initial Impression and Ddx The tonsils are swollen with exudate bilaterally.  Negative strep test a few days ago.  Still having a lot of symptoms despite appropriate management at that time, received steroids.  Given the duration of symptoms, severity of symptoms, and exam will cover with antibiotics.  Providing larger dose of steroids here in the emergency department.  Past medical/surgical history that increases complexity of ED encounter: None  Interpretation of Diagnostics Laboratory and/or imaging options to aid in the diagnosis/care of the patient were considered.  After careful history and physical examination, it was determined  that there was no indication for diagnostics at this time.  Patient Reassessment and Ultimate Disposition/Management     Discharge  Patient management required discussion with the following services or consulting groups:  None  Complexity of Problems Addressed Acute complicated illness or Injury  Additional Data Reviewed and Analyzed Further history obtained from: Further history from spouse/family member  Additional Factors Impacting ED Encounter Risk Prescriptions  Elmer Sow. Pilar Plate, MD [facility] Emergency Medicine [facility] [email or web address]  Final Clinical Impressions(s) / ED Diagnoses     ICD-10-CM   1. Pharyngitis, unspecified etiology  J02.9       ED Discharge Orders          Ordered    amoxicillin-clavulanate (AUGMENTIN) 875-125 MG tablet  Every 12 hours        10/18/22 0344             Discharge Instructions Discussed with and Provided to Patient:    Discharge Instructions      You were evaluated in the Emergency Department and after careful evaluation, we did not find any emergent condition requiring admission or further testing in the hospital.  Your exam/testing today was overall reassuring.  Take the Augmentin antibiotic to treat for any bacterial cause of your sore throat.  Continue Tylenol or Motrin at home for discomfort.  Plenty of fluids and rest.  Please return to the Emergency Department if you experience any worsening of your condition.  Thank you for allowing [place] to be a part of your care.       Sabas Sous, MD 10/18/22 [id number]

## 2022-10-18 NOTE — Discharge Instructions (Signed)
You were evaluated in the Emergency Department and after careful evaluation, we did not find any emergent condition requiring admission or further testing in the hospital.  Your exam/testing today was overall reassuring.  Take the Augmentin antibiotic to treat for any bacterial cause of your sore throat.  Continue Tylenol or Motrin at home for discomfort.  Plenty of fluids and rest.  Please return to the Emergency Department if you experience any worsening of your condition.  Thank you for allowing [place] to be a part of your care.

## 2022-10-18 NOTE — ED Triage Notes (Signed)
Pt states she was diagnosed with viral tonsillitis on Tuesday the 30th. She was told that if she wasn't feeling better by Thursday to come back. Pt states she can't swallow anything. Pt spitting in a bottle.

## 2023-01-02 ENCOUNTER — Encounter (HOSPITAL_BASED_OUTPATIENT_CLINIC_OR_DEPARTMENT_OTHER): Payer: Self-pay | Admitting: Emergency Medicine

## 2023-01-02 ENCOUNTER — Emergency Department (HOSPITAL_BASED_OUTPATIENT_CLINIC_OR_DEPARTMENT_OTHER)
Admission: EM | Admit: 2023-01-02 | Discharge: 2023-01-02 | Disposition: A | Discharge status: Home / Self Care | Payer: BC Managed Care – PPO | Attending: Emergency Medicine | Admitting: Emergency Medicine

## 2023-01-02 ENCOUNTER — Emergency Department (HOSPITAL_BASED_OUTPATIENT_CLINIC_OR_DEPARTMENT_OTHER): Payer: BC Managed Care – PPO

## 2023-01-02 DIAGNOSIS — Z3A01 Less than 8 weeks gestation of pregnancy: Secondary | ICD-10-CM | POA: Diagnosis not present

## 2023-01-02 DIAGNOSIS — R110 Nausea: Secondary | ICD-10-CM | POA: Insufficient documentation

## 2023-01-02 DIAGNOSIS — O26891 Other specified pregnancy related conditions, first trimester: Secondary | ICD-10-CM | POA: Insufficient documentation

## 2023-01-02 DIAGNOSIS — R059 Cough, unspecified: Secondary | ICD-10-CM | POA: Diagnosis not present

## 2023-01-02 DIAGNOSIS — Z20822 Contact with and (suspected) exposure to covid-19: Secondary | ICD-10-CM | POA: Diagnosis not present

## 2023-01-02 DIAGNOSIS — R1030 Lower abdominal pain, unspecified: Secondary | ICD-10-CM | POA: Diagnosis not present

## 2023-01-02 DIAGNOSIS — R197 Diarrhea, unspecified: Secondary | ICD-10-CM | POA: Insufficient documentation

## 2023-01-02 DIAGNOSIS — R509 Fever, unspecified: Secondary | ICD-10-CM | POA: Insufficient documentation

## 2023-01-02 LAB — CBC
HCT: 38.1 % (ref 36.0–46.0)
Hemoglobin: 12.1 g/dL (ref 12.0–15.0)
MCH: 23.6 pg — ABNORMAL LOW (ref 26.0–34.0)
MCHC: 31.8 g/dL (ref 30.0–36.0)
MCV: 74.3 fL — ABNORMAL LOW (ref 80.0–100.0)
Platelets: 283 10*3/uL (ref 150–400)
RBC: 5.13 MIL/uL — ABNORMAL HIGH (ref 3.87–5.11)
RDW: 14.6 % (ref 11.5–15.5)
WBC: 6.6 10*3/uL (ref 4.0–10.5)
nRBC: 0 % (ref 0.0–0.2)

## 2023-01-02 LAB — URINALYSIS, ROUTINE W REFLEX MICROSCOPIC
Bilirubin Urine: NEGATIVE
Glucose, UA: NEGATIVE mg/dL
Hgb urine dipstick: NEGATIVE
Ketones, ur: 15 mg/dL — AB
Leukocytes,Ua: NEGATIVE
Nitrite: NEGATIVE
Protein, ur: NEGATIVE mg/dL
Specific Gravity, Urine: 1.025 (ref 1.005–1.030)
pH: 7 (ref 5.0–8.0)

## 2023-01-02 LAB — RESP PANEL BY RT-PCR (RSV, FLU A&B, COVID)  RVPGX2
Influenza A by PCR: NEGATIVE
Influenza B by PCR: NEGATIVE
Resp Syncytial Virus by PCR: NEGATIVE
SARS Coronavirus 2 by RT PCR: NEGATIVE

## 2023-01-02 LAB — COMPREHENSIVE METABOLIC PANEL
ALT: 11 U/L (ref 0–44)
AST: 15 U/L (ref 15–41)
Albumin: 4.3 g/dL (ref 3.5–5.0)
Alkaline Phosphatase: 46 U/L (ref 38–126)
Anion gap: 7 (ref 5–15)
BUN: 12 mg/dL (ref 6–20)
CO2: 22 mmol/L (ref 22–32)
Calcium: 9.2 mg/dL (ref 8.9–10.3)
Chloride: 103 mmol/L (ref 98–111)
Creatinine, Ser: 0.66 mg/dL (ref 0.44–1.00)
GFR, Estimated: >60 mL/min (ref >60)
Glucose, Bld: 83 mg/dL (ref 70–99)
Potassium: 3.5 mmol/L (ref 3.5–5.1)
Sodium: 132 mmol/L — ABNORMAL LOW (ref 135–145)
Total Bilirubin: 0.4 mg/dL (ref 0.3–1.2)
Total Protein: 8.2 g/dL — ABNORMAL HIGH (ref 6.5–8.1)

## 2023-01-02 LAB — ABO/RH: ABO/RH(D): O POS

## 2023-01-02 LAB — LIPASE, BLOOD: Lipase: 31 U/L (ref 11–51)

## 2023-01-02 LAB — HCG, QUANTITATIVE, PREGNANCY: hCG, Beta Chain, Quant, S: 29460 m[IU]/mL — ABNORMAL HIGH (ref <5)

## 2023-01-02 MED ORDER — METOCLOPRAMIDE HCL 5 MG/ML IJ SOLN
10.0000 mg | Freq: Once | INTRAMUSCULAR | Status: AC
  Administered 2023-01-02: 10 mg via INTRAVENOUS
  Filled 2023-01-02: qty 2

## 2023-01-02 NOTE — ED Notes (Signed)
ED Provider at bedside. 

## 2023-01-02 NOTE — ED Triage Notes (Signed)
Pt reports yesterday she had abd cramping and fever. Took acetaminophen 1700 last night. Pt reports she feels warm and abd cramping. Has had diarrhea and a dry cough at home. Pt reports she is pregnant; LMP in May.

## 2023-01-02 NOTE — ED Provider Notes (Signed)
Olivia Morton EMERGENCY DEPARTMENT AT [facility] Provider Note   CSN: [id number] Arrival date & time: 01/02/23  1014     History  Chief Complaint  Patient presents with   Abdominal Cramping    Olivia Morton is a 32 y.o. female with medical history of PID, G1P1.  Patient presents to ED for evaluation abdominal pain in the setting of pregnancy.  The patient reports that she had a last menstrual period in May and she is currently pregnant.  She has not had her first prenatal appointment and this is set to occur next Friday.  She reports that 2 days ago she developed diarrhea that she reports persisted for 2 days.  She reports that yesterday she developed abdominal pain that is located in her lower abdominal fields and described as "knots, cramping".  She denies radiation of this pain.  She denies vaginal discharge, vaginal spotting, vaginal bleeding, vomiting.  She is endorsing slight nausea.  She is also endorsing a fever that occurred last night.  She reports that her temperature was 105 F and this was recorded with a infrared thermometer that was pointed at her forehead.  She states that she took Tylenol and this did slightly alleviate her fever.  She denies sick contacts, body aches or chills, sore throat.  She is endorsing a slight dry cough.  She denies chest pain, shortness of breath.  She again denies vaginal discharge, vaginal bleeding, vaginal spotting.  She has history of PID in 2020 however the patient denies any recent painful sex, denies any new partners.   Abdominal Cramping Associated symptoms include abdominal pain. Pertinent negatives include no chest pain and no shortness of breath.       Home Medications Prior to Admission medications   Medication Sig Start Date End Date Taking? Authorizing Provider  acetaminophen (TYLENOL) 500 MG tablet Take 1,000 mg by mouth every 6 (six) hours as needed for moderate pain or headache.    [provider]  benzonatate  (TESSALON) 100 MG capsule Take 1 capsule (100 mg total) by mouth every 8 (eight) hours. 10/16/22   Smoot, Sarah A, PA-C  ondansetron (ZOFRAN) 4 MG tablet Take 1 tablet (4 mg total) by mouth every 6 (six) hours as needed for nausea or vomiting. 05/22/22   Al Decant, PA-C      Allergies    Clindamycin/lincomycin and Grapeseed extract [nutritional supplements]    Review of Systems   Review of Systems  Constitutional:  Positive for fever.  Respiratory:  Positive for cough. Negative for shortness of breath.   Cardiovascular:  Negative for chest pain.  Gastrointestinal:  Positive for abdominal pain and nausea. Negative for vomiting.  Genitourinary:  Negative for dyspareunia, dysuria, flank pain, vaginal bleeding and vaginal discharge.  All other systems reviewed and are negative.   Physical Exam Updated Vital Signs BP (!) 94/49   Pulse 93   Temp 99.1 F (37.3 C) (Oral)   Resp 16   Ht 5\' 7"  (1.702 m)   Wt 79.4 kg   SpO2 100%   BMI 27.41 kg/m  Physical Exam Vitals and nursing note reviewed.  Constitutional:      General: She is not in acute distress.    Appearance: Normal appearance. She is not ill-appearing, toxic-appearing or diaphoretic.  HENT:     Head: Normocephalic and atraumatic.     Nose: Nose normal.     Mouth/Throat:     Mouth: Mucous membranes are moist.     Pharynx:  Oropharynx is clear.  Eyes:     Extraocular Movements: Extraocular movements intact.     Conjunctiva/sclera: Conjunctivae normal.     Pupils: Pupils are equal, round, and reactive to light.  Cardiovascular:     Rate and Rhythm: Normal rate and regular rhythm.  Pulmonary:     Effort: Pulmonary effort is normal.     Breath sounds: Normal breath sounds. No wheezing.  Abdominal:     General: Abdomen is flat. Bowel sounds are normal.     Palpations: Abdomen is soft.     Tenderness: There is abdominal tenderness. There is no right CVA tenderness or left CVA tenderness.     Comments: Abdominal  tenderness to lower abdominal fields without rebound or guarding  Musculoskeletal:     Cervical back: Normal range of motion and neck supple. No tenderness.  Skin:    General: Skin is warm and dry.     Capillary Refill: Capillary refill takes less than 2 seconds.  Neurological:     Mental Status: She is alert and oriented to person, place, and time.     ED Results / Procedures / Treatments   Labs (all labs ordered are listed, but only abnormal results are displayed) Labs Reviewed  COMPREHENSIVE METABOLIC PANEL - Abnormal; Notable for the following components:      Result Value   Sodium 132 (*)    Total Protein 8.2 (*)    All other components within normal limits  CBC - Abnormal; Notable for the following components:   RBC 5.13 (*)    MCV 74.3 (*)    MCH 23.6 (*)    All other components within normal limits  URINALYSIS, ROUTINE W REFLEX MICROSCOPIC - Abnormal; Notable for the following components:   Ketones, ur 15 (*)    All other components within normal limits  HCG, QUANTITATIVE, PREGNANCY - Abnormal; Notable for the following components:   hCG, Beta Chain, Quant, S 29,460 (*)    All other components within normal limits  RESP PANEL BY RT-PCR (RSV, FLU A&B, COVID)  RVPGX2  LIPASE, BLOOD  ABO/RH    EKG None  Radiology US OB LESS THAN 14 WEEKS WITH OB TRANSVAGINAL  Result Date: 01/02/2023 CLINICAL DATA:  [id number] Pelvic cramping [id number] EXAM: OBSTETRIC <14 WK [place] AND TRANSVAGINAL OB US TECHNIQUE: Both transabdominal and transvaginal ultrasound examinations were performed for complete evaluation of the gestation as well as the maternal uterus, adnexal regions, and pelvic cul-de-sac. Transvaginal technique was performed to assess early pregnancy. COMPARISON:  None Available. FINDINGS: Intrauterine gestational sac: Single Yolk sac:  Visualized. Embryo:  Visualized. Cardiac Activity: Visualized. Heart Rate: 123 bpm CRL:  9.1 mm   6 w   6 d                  [place] EDC: Subchorionic  hemorrhage:  None visualized. Maternal uterus/adnexae: Within normal limits. Left corpus luteal cyst noted. IMPRESSION: *Single live intrauterine gestation at 6 weeks 6 days, as per crown-rump length. Electronically Signed   By: Jules Schick M.D.   On: 01/02/2023 14:11    Procedures Procedures   Medications Ordered in ED Medications  metoCLOPramide (REGLAN) injection 10 mg (10 mg Intravenous Given 01/02/23 1124)    ED Course/ Medical Decision Making/ A&P   Medical Decision Making Amount and/or Complexity of Data Reviewed Labs: ordered. Radiology: ordered.   32 year old female presents to ED for evaluation.  Please see HPI for further details.  On examination the patient is afebrile and nontachycardic.  Her lung sounds are clear bilaterally and she is not hypoxic.  Abdomen has generalized tenderness to lower abdominal fields however no rebound or guarding, no overlying skin change.  Neurological examination at baseline, alert and oriented x 3.  Overall nontoxic in appearance.  Patient lipase WNL.  Patient respiratory panel negative for all viruses.  Patient CBC shows no leukocytosis, no anemia.  The patient CMP with sodium 132 however no other electrolyte derangement, no elevated LFT, no elevated creatinine, no elevated anion gap.  Patient hCG quant elevated at 29,460.  Patient ultrasound imaging shows a single live intrauterine gestation at 6 weeks 6 days.  Patient given 10 mg Reglan for nausea, reports he feels better at this time.  On reassessment, patient reports that her abdominal cramping is decreased.  Case discussed with attending Dr. Doristine Johns, we feel the patient is stable to discharge home at this time.  She will follow-up with her OB/GYN on Friday as discussed and previously scheduled.  She will return to the ED with any new or worsening signs or symptoms.  She was given return precautions and she voiced understanding.  She had all of her questions answered to her satisfaction.   Stable to discharge home.   Final Clinical Impression(s) / ED Diagnoses Final diagnoses:  Abdominal pain during pregnancy in first trimester    Rx / DC Orders ED Discharge Orders     None         Al Decant, PA-C 01/02/23 1459    Cathren Laine, MD 01/03/23 1327

## 2023-01-02 NOTE — Discharge Instructions (Signed)
It was a pleasure taking part in your care today.  As we discussed, your workup was very reassuring.  The ultrasound imaging showed a live intrauterine gestational pregnancy at 6 weeks 6 days.  Please continue to monitor your symptoms at home.  Please follow-up with your OB/GYN on Friday as we discussed.  If you develop any new or worsening signs or symptoms such as increased abdominal pain, continued abdominal pain, vaginal bleeding or spotting please return to the ED for further management.  Please read the attached guide concerning abdominal pain during pregnancy for further management and information.

## 2023-01-08 ENCOUNTER — Emergency Department (HOSPITAL_BASED_OUTPATIENT_CLINIC_OR_DEPARTMENT_OTHER)
Admission: EM | Admit: 2023-01-08 | Discharge: 2023-01-08 | Disposition: A | Discharge status: Home / Self Care | Payer: BC Managed Care – PPO | Attending: Emergency Medicine | Admitting: Emergency Medicine

## 2023-01-08 ENCOUNTER — Encounter (HOSPITAL_BASED_OUTPATIENT_CLINIC_OR_DEPARTMENT_OTHER): Payer: Self-pay | Admitting: Emergency Medicine

## 2023-01-08 ENCOUNTER — Other Ambulatory Visit: Payer: Self-pay

## 2023-01-08 DIAGNOSIS — O211 Hyperemesis gravidarum with metabolic disturbance: Secondary | ICD-10-CM | POA: Diagnosis not present

## 2023-01-08 DIAGNOSIS — Z20822 Contact with and (suspected) exposure to covid-19: Secondary | ICD-10-CM | POA: Insufficient documentation

## 2023-01-08 DIAGNOSIS — Z3A01 Less than 8 weeks gestation of pregnancy: Secondary | ICD-10-CM | POA: Insufficient documentation

## 2023-01-08 DIAGNOSIS — O99011 Anemia complicating pregnancy, first trimester: Secondary | ICD-10-CM | POA: Insufficient documentation

## 2023-01-08 DIAGNOSIS — R112 Nausea with vomiting, unspecified: Secondary | ICD-10-CM

## 2023-01-08 DIAGNOSIS — D509 Iron deficiency anemia, unspecified: Secondary | ICD-10-CM | POA: Insufficient documentation

## 2023-01-08 DIAGNOSIS — O219 Vomiting of pregnancy, unspecified: Secondary | ICD-10-CM | POA: Diagnosis present

## 2023-01-08 LAB — URINALYSIS, ROUTINE W REFLEX MICROSCOPIC
Bilirubin Urine: NEGATIVE
Glucose, UA: NEGATIVE mg/dL
Hgb urine dipstick: NEGATIVE
Ketones, ur: NEGATIVE mg/dL
Leukocytes,Ua: NEGATIVE
Nitrite: NEGATIVE
Protein, ur: NEGATIVE mg/dL
Specific Gravity, Urine: 1.025 (ref 1.005–1.030)
pH: 7.5 (ref 5.0–8.0)

## 2023-01-08 LAB — COMPREHENSIVE METABOLIC PANEL
ALT: 9 U/L (ref 0–44)
AST: 13 U/L — ABNORMAL LOW (ref 15–41)
Albumin: 3.5 g/dL (ref 3.5–5.0)
Alkaline Phosphatase: 38 U/L (ref 38–126)
Anion gap: 7 (ref 5–15)
BUN: 9 mg/dL (ref 6–20)
CO2: 23 mmol/L (ref 22–32)
Calcium: 8.8 mg/dL — ABNORMAL LOW (ref 8.9–10.3)
Chloride: 104 mmol/L (ref 98–111)
Creatinine, Ser: 0.62 mg/dL (ref 0.44–1.00)
GFR, Estimated: >60 mL/min (ref >60)
Glucose, Bld: 90 mg/dL (ref 70–99)
Potassium: 3.2 mmol/L — ABNORMAL LOW (ref 3.5–5.1)
Sodium: 134 mmol/L — ABNORMAL LOW (ref 135–145)
Total Bilirubin: 0.4 mg/dL (ref 0.3–1.2)
Total Protein: 6.4 g/dL — ABNORMAL LOW (ref 6.5–8.1)

## 2023-01-08 LAB — WET PREP, GENITAL
Clue Cells Wet Prep HPF POC: NONE SEEN
Sperm: NONE SEEN
Trich, Wet Prep: NONE SEEN
WBC, Wet Prep HPF POC: <10 (ref <10)
Yeast Wet Prep HPF POC: NONE SEEN

## 2023-01-08 LAB — RESP PANEL BY RT-PCR (RSV, FLU A&B, COVID)  RVPGX2
Influenza A by PCR: NEGATIVE
Influenza B by PCR: NEGATIVE
Resp Syncytial Virus by PCR: NEGATIVE
SARS Coronavirus 2 by RT PCR: NEGATIVE

## 2023-01-08 LAB — CBC
HCT: 33 % — ABNORMAL LOW (ref 36.0–46.0)
Hemoglobin: 10.6 g/dL — ABNORMAL LOW (ref 12.0–15.0)
MCH: 24 pg — ABNORMAL LOW (ref 26.0–34.0)
MCHC: 32.1 g/dL (ref 30.0–36.0)
MCV: 74.8 fL — ABNORMAL LOW (ref 80.0–100.0)
Platelets: 273 10*3/uL (ref 150–400)
RBC: 4.41 MIL/uL (ref 3.87–5.11)
RDW: 14.4 % (ref 11.5–15.5)
WBC: 5.5 10*3/uL (ref 4.0–10.5)
nRBC: 0 % (ref 0.0–0.2)

## 2023-01-08 LAB — MAGNESIUM: Magnesium: 1.8 mg/dL (ref 1.7–2.4)

## 2023-01-08 MED ORDER — METOCLOPRAMIDE HCL 5 MG/ML IJ SOLN
10.0000 mg | Freq: Once | INTRAMUSCULAR | Status: AC
  Administered 2023-01-08: 10 mg via INTRAVENOUS
  Filled 2023-01-08: qty 2

## 2023-01-08 MED ORDER — POTASSIUM CHLORIDE CRYS ER 20 MEQ PO TBCR
40.0000 meq | EXTENDED_RELEASE_TABLET | Freq: Once | ORAL | Status: AC
  Administered 2023-01-08: 40 meq via ORAL
  Filled 2023-01-08: qty 2

## 2023-01-08 MED ORDER — POTASSIUM CHLORIDE CRYS ER 20 MEQ PO TBCR: 20.0000 meq | EXTENDED_RELEASE_TABLET | Freq: Every day | ORAL | 0 refills | Status: AC

## 2023-01-08 MED ORDER — DIPHENHYDRAMINE HCL 50 MG/ML IJ SOLN
25.0000 mg | Freq: Once | INTRAMUSCULAR | Status: AC
  Administered 2023-01-08: 25 mg via INTRAVENOUS
  Filled 2023-01-08: qty 1

## 2023-01-08 MED ORDER — LACTATED RINGERS IV BOLUS
1000.0000 mL | Freq: Once | INTRAVENOUS | Status: AC
  Administered 2023-01-08: 1000 mL via INTRAVENOUS

## 2023-01-08 MED ORDER — PROMETHAZINE HCL 25 MG RE SUPP: 25.0000 mg | Freq: Four times a day (QID) | RECTAL | 0 refills | Status: AC | PRN

## 2023-01-08 NOTE — ED Notes (Signed)
Pt very sleepy rouses to voice

## 2023-01-08 NOTE — ED Notes (Signed)
Patient is unable to urinate at this time 

## 2023-01-08 NOTE — ED Notes (Signed)
Pt took meds with sprite

## 2023-01-08 NOTE — ED Notes (Signed)
Pt calling for ride home because she is sooo sleepy

## 2023-01-08 NOTE — ED Provider Notes (Signed)
[place] [facility] Provider Note   CSN: [id number] Arrival date & time: 01/08/23  0840     History  Chief Complaint  Patient presents with   Fever    Olivia Morton is a 32 y.o. female.   Fever   32 year old female presents emergency department with complaints of nausea, vomiting, feelings of dehydration, fever.  Patient states that she is 7 weeks and 5 days pregnant, G2, P1 L1.  States that throughout this pregnancy, has been dealing with increased nauseous feelings as well as episodes of vomiting.  Patient reports over the past few days tolerating minimally p.o. with 3 episodes of emesis yesterday.  States she has been taking her at home antiemetic which has been helping some but still with p.o. intolerance.  States she has been having intermittent fever at home of which has been controlled with Tylenol.  Denies any cough, congestion, chest pain, shortness of breath, abdominal pain, hematemesis, coffee-ground emesis, urinary symptoms, change in bowel habits.  She does report thin white vaginal discharge since Friday of last week but states she is in a presumedly monogamous relationship and is not concerned about STDs.  Past medical history significant for PID, anemia  Home Medications Prior to Admission medications   Medication Sig Start Date End Date Taking? Authorizing Provider  potassium chloride SA (KLOR-CON M) 20 MEQ tablet Take 1 tablet (20 mEq total) by mouth daily. 01/09/23  Yes Peter Garter, PA  promethazine (PHENERGAN) 25 MG suppository Place 1 suppository (25 mg total) rectally every 6 (six) hours as needed for nausea or vomiting. 01/08/23  Yes Sherian Maroon A, PA  acetaminophen (TYLENOL) 500 MG tablet Take 1,000 mg by mouth every 6 (six) hours as needed for moderate pain or headache.    [provider]  benzonatate (TESSALON) 100 MG capsule Take 1 capsule (100 mg total) by mouth every 8 (eight) hours. 10/16/22   Smoot, Sarah  A, PA-C  ondansetron (ZOFRAN) 4 MG tablet Take 1 tablet (4 mg total) by mouth every 6 (six) hours as needed for nausea or vomiting. 05/22/22   Al Decant, PA-C      Allergies    Clindamycin/lincomycin and Grapeseed extract [nutritional supplements]    Review of Systems   Review of Systems  Constitutional:  Positive for fever.  All other systems reviewed and are negative.   Physical Exam Updated Vital Signs BP 105/61   Pulse 73   Temp 98.4 F (36.9 C) (Oral)   Resp 16   Ht 5\' 7"  (1.702 m)   Wt 79.4 kg   SpO2 100%   BMI 27.42 kg/m  Physical Exam Vitals and nursing note reviewed.  Constitutional:      General: She is not in acute distress.    Appearance: She is well-developed.  HENT:     Head: Normocephalic and atraumatic.     Nose: Nose normal.     Mouth/Throat:     Mouth: Mucous membranes are dry.     Pharynx: Oropharynx is clear.     Comments: Dry mucous membranes. Eyes:     Conjunctiva/sclera: Conjunctivae normal.  Cardiovascular:     Rate and Rhythm: Normal rate and regular rhythm.     Heart sounds: No murmur heard. Pulmonary:     Effort: Pulmonary effort is normal. No respiratory distress.     Breath sounds: Normal breath sounds. No wheezing, rhonchi or rales.  Abdominal:     Palpations: Abdomen is soft.  Tenderness: There is no abdominal tenderness. There is no guarding.  Genitourinary:    Comments: Patient declined pelvic exam at this time in favor of self swab Musculoskeletal:        General: No swelling.     Cervical back: Neck supple.  Skin:    General: Skin is warm and dry.     Capillary Refill: Capillary refill takes less than 2 seconds.  Neurological:     Mental Status: She is alert.  Psychiatric:        Mood and Affect: Mood normal.     ED Results / Procedures / Treatments   Labs (all labs ordered are listed, but only abnormal results are displayed) Labs Reviewed  COMPREHENSIVE METABOLIC PANEL - Abnormal; Notable for the  following components:      Result Value   Sodium 134 (*)    Potassium 3.2 (*)    Calcium 8.8 (*)    Total Protein 6.4 (*)    AST 13 (*)    All other components within normal limits  CBC - Abnormal; Notable for the following components:   Hemoglobin 10.6 (*)    HCT 33.0 (*)    MCV 74.8 (*)    MCH 24.0 (*)    All other components within normal limits  RESP PANEL BY RT-PCR (RSV, FLU A&B, COVID)  RVPGX2  WET PREP, GENITAL  MAGNESIUM  URINALYSIS, ROUTINE W REFLEX MICROSCOPIC  GC/CHLAMYDIA PROBE AMP ([place]) NOT AT [facility]    EKG None  Radiology No results found.  Procedures Procedures    Medications Ordered in ED Medications  lactated ringers bolus 1,000 mL (0 mLs Intravenous Stopped 01/08/23 1039)  metoCLOPramide (REGLAN) injection 10 mg (10 mg Intravenous Given 01/08/23 1002)  diphenhydrAMINE (BENADRYL) injection 25 mg (25 mg Intravenous Given 01/08/23 1002)  potassium chloride SA (KLOR-CON M) CR tablet 40 mEq (40 mEq Oral Given 01/08/23 1043)  lactated ringers bolus 1,000 mL (1,000 mLs Intravenous New Bag/Given 01/08/23 1242)    ED Course/ Medical Decision Making/ A&P Clinical Course as of 01/08/23 1326  Tue Jan 08, 2023  0914 7 weeks 5 days\ [CR]    Clinical Course User Index [CR] Peter Garter, PA                             Medical Decision Making Amount and/or Complexity of Data Reviewed Labs: ordered.  Risk Prescription drug management.   This patient presents to the ED for concern of nausea, vomiting, this involves an extensive number of treatment options, and is a complaint that carries with it a high risk of complications and morbidity.  The differential diagnosis includes pancreatitis, gastritis, PUD, hyperemesis gravidarum, SBO/LBO, volvulus, diverticulitis, appendicitis, pyelonephritis, nephrolithiasis, cystitis, GERD   Co morbidities that complicate the patient evaluation  See HPI   Additional history obtained:  Additional history  obtained from EMR External records from outside source obtained and reviewed including hospital records   Lab Tests:  I Ordered, and personally interpreted labs.  The pertinent results include: Mild hyponatremia and hypokalemia of 134 320 respectively with mild hypocalcemia of 8.8.  No transaminitis.  No renal dysfunction.  No leukocytosis.  Patient with evidence of microcytic anemia with a hemoglobin of 10.6 of which is near patient's baseline.  UA without abnormality.  Wet prep negative.  Respiratory viral panel negative.   Imaging Studies ordered:  N/a   Cardiac Monitoring: / EKG:  The patient was maintained on  a cardiac monitor.  I personally viewed and interpreted the cardiac monitored which showed an underlying rhythm of: Sinus rhythm   Consultations Obtained:  N/a   Problem List / ED Course / Critical interventions / Medication management  Nausea, vomiting I ordered medication including 1 L of lactated Ringer's, Benadryl, Reglan, potassium chloride   Reevaluation of the patient after these medicines showed that the patient improved I have reviewed the patients home medicines and have made adjustments as needed   Social Determinants of Health:  Denies tobacco, illicit drug use   Test / Admission - Considered:  Nausea, vomiting Vitals signs within normal range and stable throughout visit. Laboratory/imaging studies significant for: See above 32 year old female presents emergency department complaints of continued nausea/vomiting.  Patient seems to have history of similar episodes with prior pregnancy and has been seen a few different times during this pregnancy for similar symptoms.  Patient reports fever yesterday at home but has been afebrile today.  Patient without reporting of abdominal pain without reproducible tenderness on exam so imaging was not pursued.  Regarding nausea/vomiting, patient with mild evidence of hyponatremia, hypokalemia which supplemented via IV  fluids and potassium chloride while in the ED.  Nausea/emesis most likely secondary to current pregnancy.  Patient did not meet criteria for hyperemesis gravidarum so and was not deemed necessary.  Patient tolerating p.o.  Patient has Zofran at home but will prescribe Phenergan rectally for nausea/emesis refractory to her Zofran.  Regarding fever, patient without evidence of leukocytosis, fever today, appreciable infectious source on HPI/PE.  Will have patient monitor temperature at home and if development of fever again, return to emergency department or be seen by provider.  Treatment plan discussed at length with patient and she acknowledged understanding was agreeable to said plan.  Patient overall well-appearing, febrile no acute distress. Worrisome signs and symptoms were discussed with the patient, and the patient acknowledged understanding to return to the ED if noticed. Patient was stable upon discharge.          Final Clinical Impression(s) / ED Diagnoses Final diagnoses:  Nausea and vomiting, unspecified vomiting type    Rx / DC Orders ED Discharge Orders          Ordered    potassium chloride SA (KLOR-CON M) 20 MEQ tablet  Daily        01/08/23 1221    promethazine (PHENERGAN) 25 MG suppository  Every 6 hours PRN        01/08/23 1221              Peter Garter, [place] 01/08/23 1326    Pricilla Loveless, MD 01/09/23 [phone]

## 2023-01-08 NOTE — Discharge Instructions (Addendum)
As discussed, workup today overall reassuring.  You did have evidence of slightly low potassium of which we will replace over the next few days.  Will also send in different nausea/vomiting medication to use as needed when your Zofran does not work.  Recommend follow-up with OB/GYN for evaluation of your symptoms.  Follow MyChart for the results of your gonorrhea/chlamydia testing.  Please do not hesitate to return to the emergency department for worrisome signs and symptoms we discussed become apparent.

## 2023-01-08 NOTE — ED Notes (Signed)
Discharge paperwork reviewed entirely with patient, including follow up care. Pain was under control. The patient received instruction and coaching on their prescriptions, and all follow-up questions were answered.  Pt verbalized understanding as well as all parties involved. No questions or concerns voiced at the time of discharge. No acute distress noted.   Pt ambulated out to PVA without incident or assistance.  

## 2023-01-08 NOTE — ED Triage Notes (Addendum)
Pt states is 7 weeks preg States  had a fever last night  of  104 at 1215a  states took tylenol  states feels dry G 2 P 1  L1   Temp now 98.7  States has taken some of the nausea meds we gave her last time Denies cramping b leeding  or abd pain, denies dysuria

## 2023-01-08 NOTE — ED Notes (Signed)
Pt very sleepy  bp soft PA Excell Seltzer made aware  he was into eval pt 2nd liter fluid started

## 2023-01-09 LAB — GC/CHLAMYDIA PROBE AMP (~~LOC~~) NOT AT ARMC
Chlamydia: NEGATIVE
Comment: NEGATIVE
Comment: NORMAL
Neisseria Gonorrhea: NEGATIVE

## 2023-01-11 ENCOUNTER — Encounter (HOSPITAL_COMMUNITY): Payer: Self-pay

## 2023-01-11 ENCOUNTER — Other Ambulatory Visit: Payer: Self-pay

## 2023-01-11 ENCOUNTER — Inpatient Hospital Stay (HOSPITAL_COMMUNITY)
Admission: AD | Admit: 2023-01-11 | Discharge: 2023-01-12 | Discharge status: Against Medical Advice | Payer: BC Managed Care – PPO | Attending: Family Medicine | Admitting: Family Medicine

## 2023-01-11 DIAGNOSIS — Z9181 History of falling: Secondary | ICD-10-CM | POA: Insufficient documentation

## 2023-01-11 DIAGNOSIS — M549 Dorsalgia, unspecified: Secondary | ICD-10-CM | POA: Insufficient documentation

## 2023-01-11 DIAGNOSIS — R109 Unspecified abdominal pain: Secondary | ICD-10-CM | POA: Insufficient documentation

## 2023-01-11 NOTE — MAU Note (Signed)
.  Olivia Morton is a 32 y.o. at Unknown here in MAU reporting: abdominal and back pain. Pt reports that her stomach feels extremely tight and in knots. Pt fell down the stairs an hour and the pain began after the fall.  Onset of complaint: 1 hr ago Pain score: 10/10 There were no vitals filed for this visit.    Lab orders placed from triage:  none

## 2023-01-12 ENCOUNTER — Encounter (HOSPITAL_COMMUNITY): Payer: Self-pay

## 2023-01-12 DIAGNOSIS — M549 Dorsalgia, unspecified: Secondary | ICD-10-CM | POA: Diagnosis present

## 2023-01-12 DIAGNOSIS — Z9181 History of falling: Secondary | ICD-10-CM | POA: Diagnosis present

## 2023-01-12 DIAGNOSIS — R109 Unspecified abdominal pain: Secondary | ICD-10-CM | POA: Diagnosis not present

## 2023-05-11 ENCOUNTER — Encounter (HOSPITAL_BASED_OUTPATIENT_CLINIC_OR_DEPARTMENT_OTHER): Payer: Self-pay

## 2023-05-11 ENCOUNTER — Emergency Department (HOSPITAL_BASED_OUTPATIENT_CLINIC_OR_DEPARTMENT_OTHER)
Admission: EM | Admit: 2023-05-11 | Discharge: 2023-05-11 | Disposition: A | Discharge status: Home / Self Care | Payer: BC Managed Care – PPO | Attending: Emergency Medicine | Admitting: Emergency Medicine

## 2023-05-11 ENCOUNTER — Other Ambulatory Visit: Payer: Self-pay

## 2023-05-11 DIAGNOSIS — H5712 Ocular pain, left eye: Secondary | ICD-10-CM | POA: Diagnosis present

## 2023-05-11 DIAGNOSIS — S0502XA Injury of conjunctiva and corneal abrasion without foreign body, left eye, initial encounter: Secondary | ICD-10-CM | POA: Insufficient documentation

## 2023-05-11 DIAGNOSIS — W458XXA Other foreign body or object entering through skin, initial encounter: Secondary | ICD-10-CM | POA: Insufficient documentation

## 2023-05-11 MED ORDER — ERYTHROMYCIN 5 MG/GM OP OINT: TOPICAL_OINTMENT | OPHTHALMIC | 0 refills | Status: AC

## 2023-05-11 MED ORDER — FLUORESCEIN SODIUM 1 MG OP STRP
1.0000 | ORAL_STRIP | Freq: Once | OPHTHALMIC | Status: AC
  Administered 2023-05-11: 1 via OPHTHALMIC
  Filled 2023-05-11: qty 1

## 2023-05-11 MED ORDER — ERYTHROMYCIN 5 MG/GM OP OINT
TOPICAL_OINTMENT | Freq: Once | OPHTHALMIC | Status: AC
  Filled 2023-05-11: qty 3.5

## 2023-05-11 MED ORDER — TETRACAINE HCL 0.5 % OP SOLN
2.0000 [drp] | Freq: Once | OPHTHALMIC | Status: AC
  Administered 2023-05-11: 2 [drp] via OPHTHALMIC
  Filled 2023-05-11: qty 4

## 2023-05-11 NOTE — ED Notes (Signed)
Visual acuity.   Both 20/25 Left 20/40 Right 20/25

## 2023-05-11 NOTE — Discharge Instructions (Signed)
It was a pleasure taking care of you here in the emergency department.  I have started you on antibiotic ointment for your eye.  It is normal after putting the ointment in your eye to have cloudy vision.  Make sure to follow-up with ophthalmology.  The numbers listed in your discharge paperwork.  Call to schedule an appointment on Monday.  Return for new or worsening symptoms.

## 2023-05-11 NOTE — ED Provider Notes (Signed)
[place] [facility] Provider Note   CSN: [id number] Arrival date & time: 05/11/23  1737    History  Chief Complaint  Patient presents with   Eye Problem    Olivia Morton is a 32 y.o. female.  Here for evaluation of left eye pain and clear watery drainage.  States she was at the nail salon earlier today.  They were clipping of her own nails and when they clipped that it flew into her left eye.  She said scratching sensation as well as clear, watery drainage to the eye.  No vision changes.  No headache.  She states she is mostly wearing glasses or contacts however does not. Doe snot follow with Optho.   Of note, patient has a documented positive pregnancy test from August.  Patient states she is no longer pregnant.   HPI     Home Medications Prior to Admission medications   Medication Sig Start Date End Date Taking? Authorizing Provider  erythromycin ophthalmic ointment Place a 1/2 inch ribbon of ointment into the lower eyelid twice daily 05/11/23  Yes Aracelly Tencza A, PA-C  acetaminophen (TYLENOL) 500 MG tablet Take 1,000 mg by mouth every 6 (six) hours as needed for moderate pain or headache.    [provider]  benzonatate (TESSALON) 100 MG capsule Take 1 capsule (100 mg total) by mouth every 8 (eight) hours. 10/16/22   Smoot, Sarah A, PA-C  ondansetron (ZOFRAN) 4 MG tablet Take 1 tablet (4 mg total) by mouth every 6 (six) hours as needed for nausea or vomiting. 05/22/22   Al Decant, PA-C  potassium chloride SA (KLOR-CON M) 20 MEQ tablet Take 1 tablet (20 mEq total) by mouth daily. 01/09/23   Peter Garter, PA  promethazine (PHENERGAN) 25 MG suppository Place 1 suppository (25 mg total) rectally every 6 (six) hours as needed for nausea or vomiting. 01/08/23   Peter Garter, PA      Allergies    Clindamycin/lincomycin and Grapeseed extract [nutritional supplements]    Review of Systems   Review of Systems   Constitutional: Negative.   HENT: Negative.    Eyes:  Positive for pain, discharge and redness. Negative for photophobia, itching and visual disturbance.  Respiratory: Negative.    Cardiovascular: Negative.   Gastrointestinal: Negative.   Genitourinary: Negative.   Musculoskeletal: Negative.   Skin: Negative.   Neurological: Negative.   All other systems reviewed and are negative.   Physical Exam Updated Vital Signs BP 130/83 (BP Location: Right Arm)   Pulse 81   Temp 98.6 F (37 C) (Oral)   Resp 18   Ht 5\' 7"  (1.702 m)   Wt 84.4 kg   LMP 10/16/2022 (Exact Date)   SpO2 100%   BMI 29.13 kg/m  Physical Exam Vitals and nursing note reviewed.  Constitutional:      General: She is not in acute distress.    Appearance: She is well-developed. She is not ill-appearing, toxic-appearing or diaphoretic.  HENT:     Head: Atraumatic. No raccoon eyes, Battle's sign, abrasion, contusion, masses, right periorbital erythema, left periorbital erythema or laceration. Hair is normal.     Comments: No raccoon eye, Battle sign Eyes:     General: No allergic shiner, visual field deficit or scleral icterus.       Right eye: No foreign body, discharge or hordeolum.        Left eye: Discharge (clear, watery) present.No foreign body or hordeolum.  Extraocular Movements: Extraocular movements intact.     Conjunctiva/sclera:     Right eye: Right conjunctiva is not injected. No chemosis, exudate or hemorrhage.    Left eye: Left conjunctiva is injected. No chemosis, exudate or hemorrhage.    Pupils: Pupils are equal, round, and reactive to light.     Visual Fields: Right eye visual fields normal and left eye visual fields normal.     Comments: PERRLA, conjunctive on the right clear.  Left conjunctival with minimal injection, clear watery drainage to left eye.  No nystagmus bilaterally.  No obvious traumatic hyphema.  Eyelids everted, no foreign object, negative Seidel sign, punctate fluorescein  uptake to the 8 o'clock position.  Cardiovascular:     Rate and Rhythm: Normal rate.  Pulmonary:     Effort: No respiratory distress.  Abdominal:     General: There is no distension.  Musculoskeletal:        General: Normal range of motion.     Cervical back: Normal range of motion.  Skin:    General: Skin is warm and dry.  Neurological:     General: No focal deficit present.     Mental Status: She is alert.  Psychiatric:        Mood and Affect: Mood normal.     ED Results / Procedures / Treatments   Labs (all labs ordered are listed, but only abnormal results are displayed) Labs Reviewed - No data to display  EKG None  Radiology No results found.  Procedures Procedures    Medications Ordered in ED Medications  tetracaine (PONTOCAINE) 0.5 % ophthalmic solution 2 drop (2 drops Left Eye Given by Other 05/11/23 1804)  fluorescein ophthalmic strip 1 strip (1 strip Left Eye Given by Other 05/11/23 1803)  erythromycin ophthalmic ointment ( Left Eye Given 05/11/23 1839)    ED Course/ Medical Decision Making/ A&P   32 year old here for evaluation of left eye pain and watery drainage after possible foreign object while getting her nails clipped off earlier today.  Incident occurred at 9 AM.  Clear, watery drainage.  No symptoms prior to event.  No vision changes.  No raccoon eye, Battle sign.  She has some very minimal conjunctival injection on the left eye with clear, watery drainage.  PERRLA bilaterally.  No obvious traumatic hyphema.  Negative Seidel sign.  She has punctate fluorescein uptake at the 8 o'clock position.  She is supposed to wear contacts or glasses however does not.  Does not follow with orthopedics.  Will write for erythromycin ointment.  Of note she does have positive pregnancy test from August in the computer however states she is no longer pregnant.  Encouraged her to follow-up with ophthalmology outpatient  The patient has been appropriately medically  screened and/or stabilized in the ED. I have low suspicion for any other emergent medical condition which would require further screening, evaluation or treatment in the ED or require inpatient management.  Patient is hemodynamically stable and in no acute distress.  Patient able to ambulate in department prior to ED.  Evaluation does not show acute pathology that would require ongoing or additional emergent interventions while in the emergency department or further inpatient treatment.  I have discussed the diagnosis with the patient and answered all questions.  Pain is been managed while in the emergency department and patient has no further complaints prior to discharge.  Patient is comfortable with plan discussed in room and is stable for discharge at this time.  I have  discussed strict return precautions for returning to the emergency department.  Patient was encouraged to follow-up with PCP/specialist refer to at discharge.                                 Medical Decision Making Amount and/or Complexity of Data Reviewed External Data Reviewed: labs, radiology and notes.  Risk OTC drugs. Prescription drug management. Decision regarding hospitalization. Diagnosis or treatment significantly limited by social determinants of health.           Final Clinical Impression(s) / ED Diagnoses Final diagnoses:  Abrasion of left cornea, initial encounter    Rx / DC Orders ED Discharge Orders          Ordered    erythromycin ophthalmic ointment        05/11/23 1838              Envy Meno A, PA-C 05/11/23 1841    Virgina Norfolk, DO 05/11/23 1843

## 2023-05-11 NOTE — ED Triage Notes (Signed)
The patient was at the nail salon today and a part of the nail went into her left eye. Now having pain.

## 2023-06-23 IMAGING — US US ABDOMEN LIMITED
1 series · 14 of 25 positions shown · non-contrast
Comparison: CT from previous day

CLINICAL DATA: Abdominal pain

EXAM:
ULTRASOUND ABDOMEN LIMITED RIGHT UPPER QUADRANT

[Series 1: us abdomen limited ruq (liver/gb) · 14 of 28 slices shown]
[im 1/28]
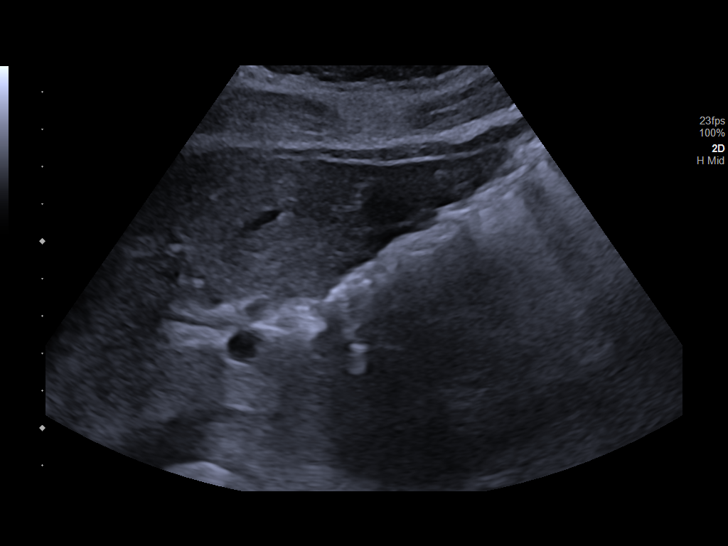
[im 3/28]
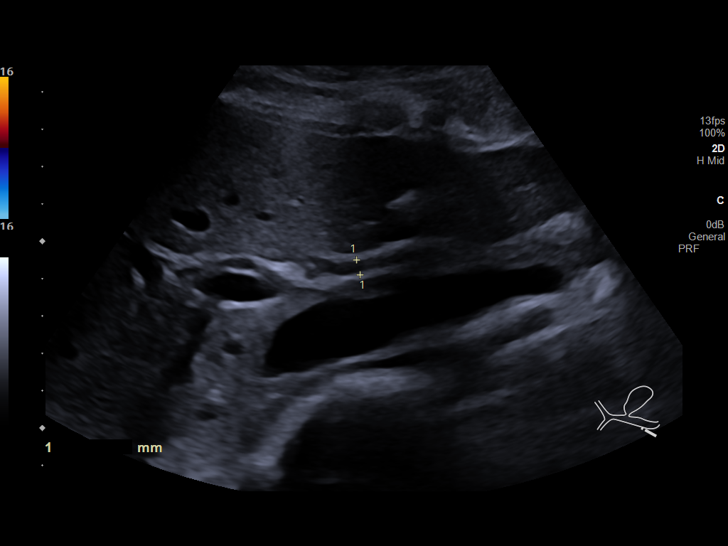
[im 5/28]
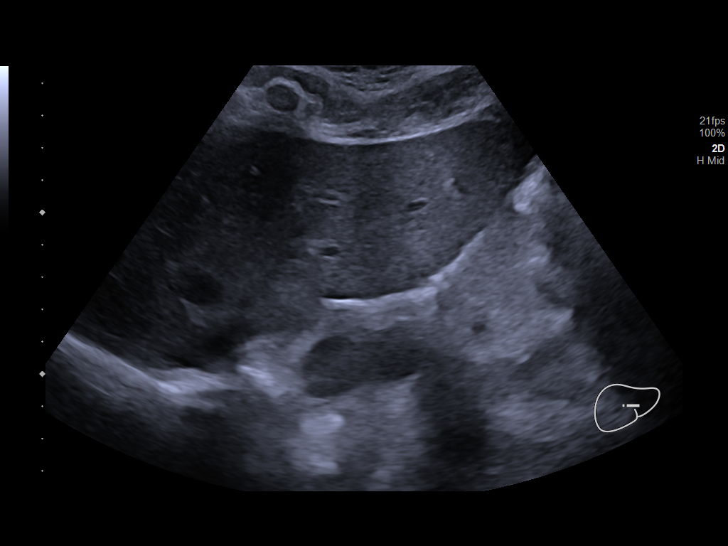
[im 7/28]
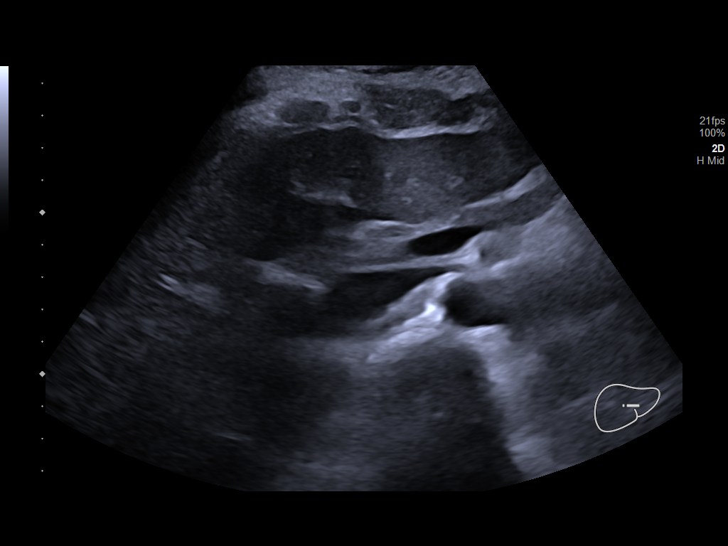
[im 10/28]
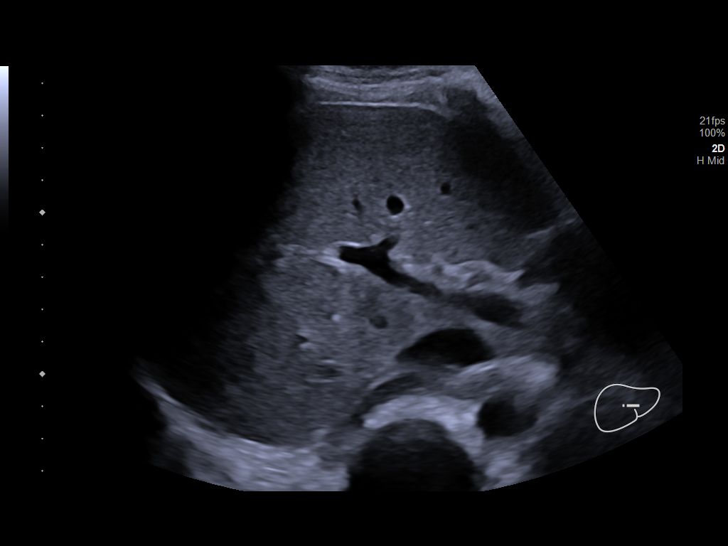
[im 11/28]
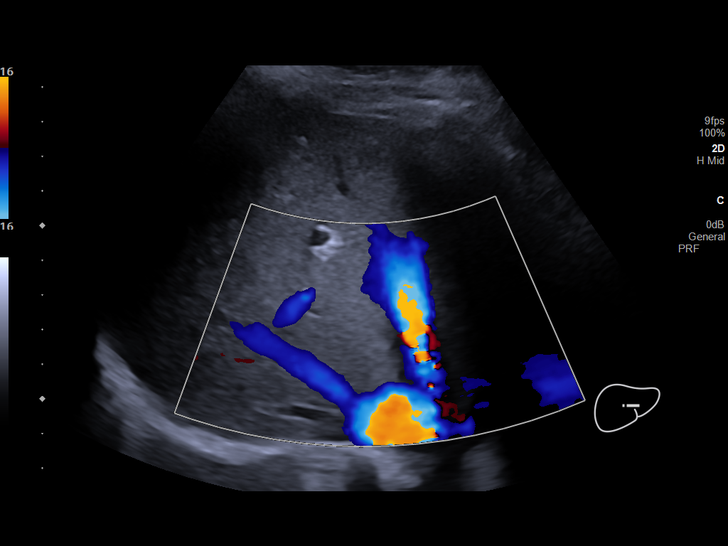
[im 13/28]
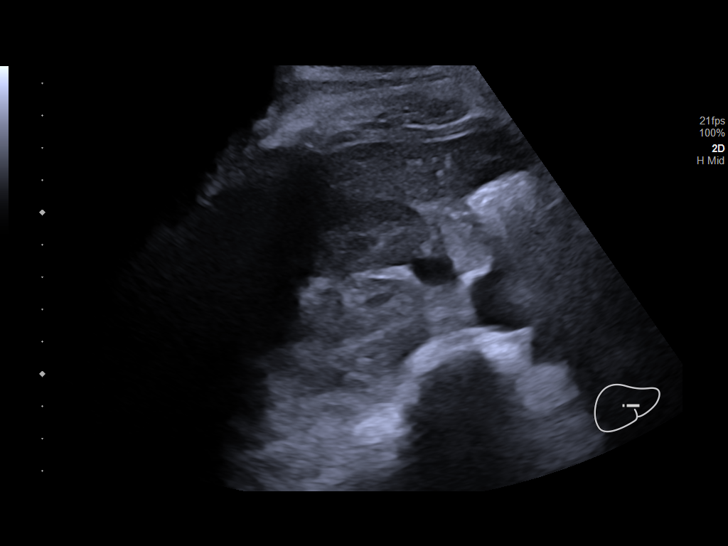
[im 15/28]
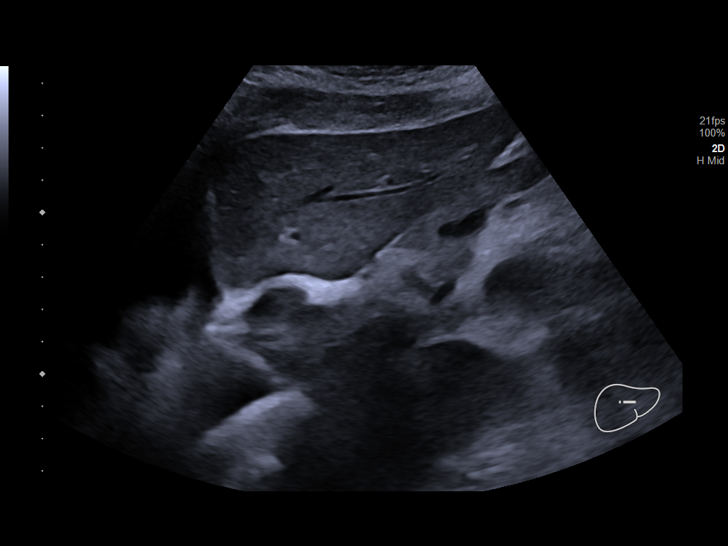
[im 17/28]
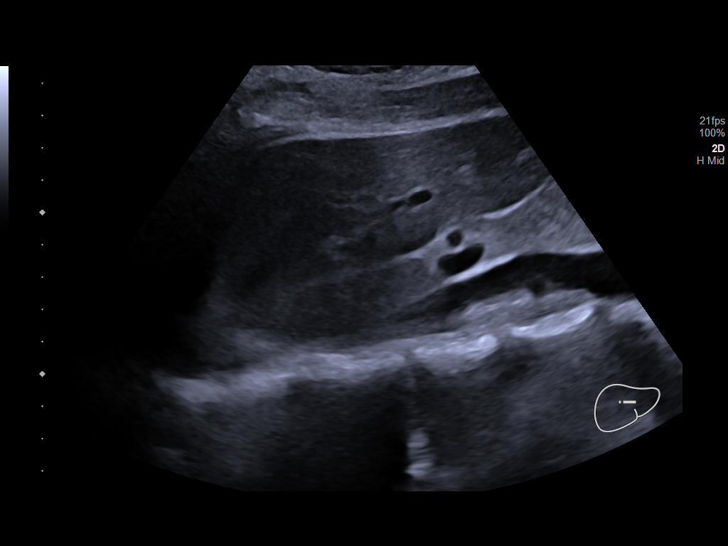
[im 19/28]
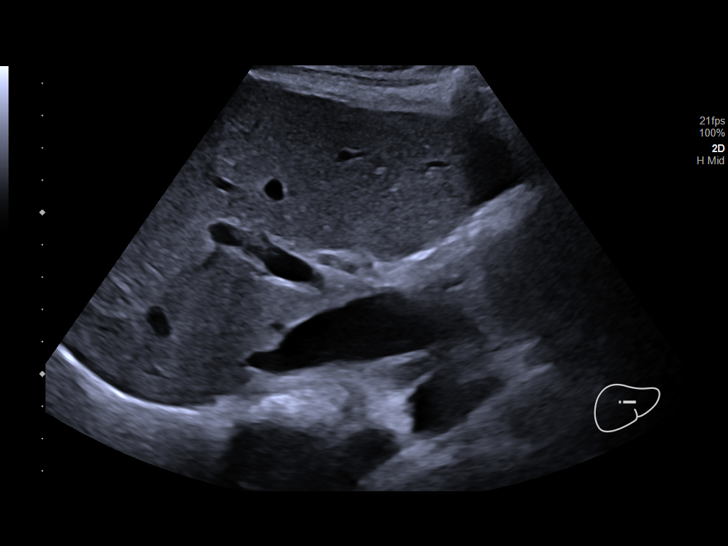
[im 21/28]
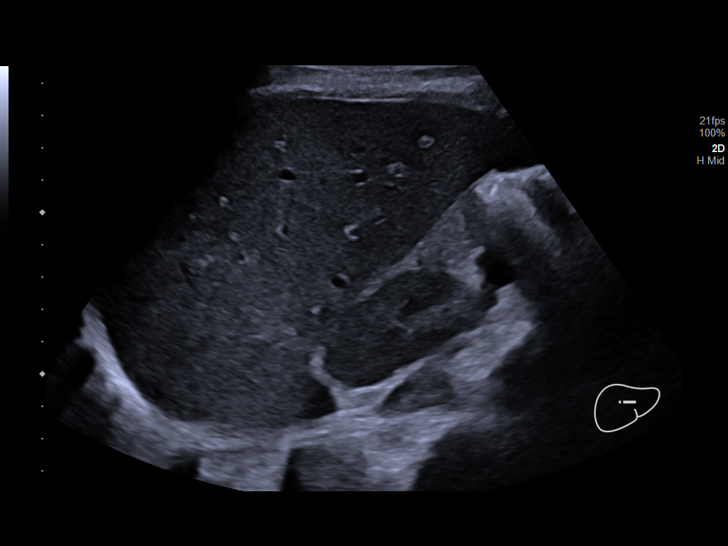
[im 23/28]
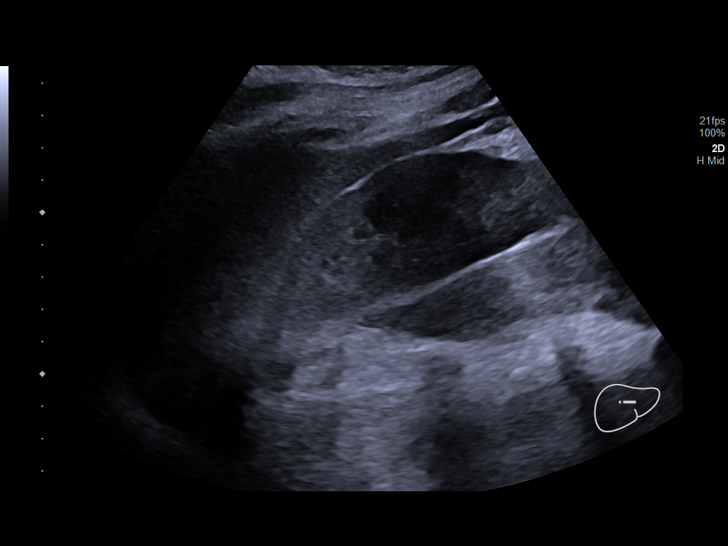
[im 25/28]
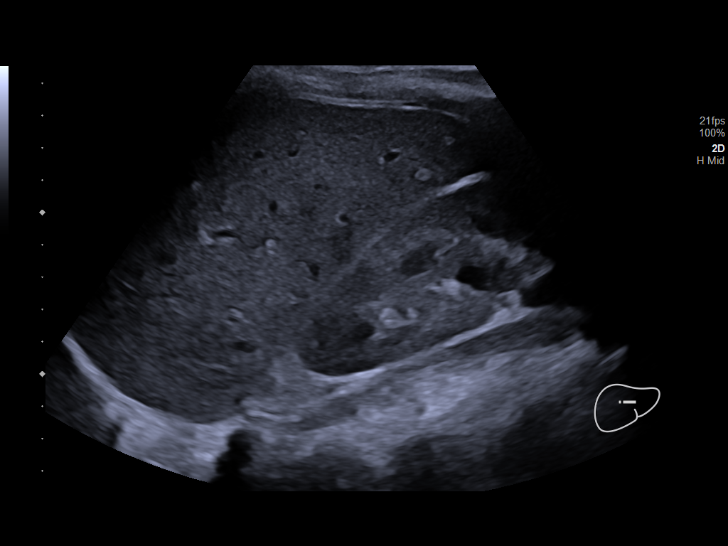
[im 28/28]
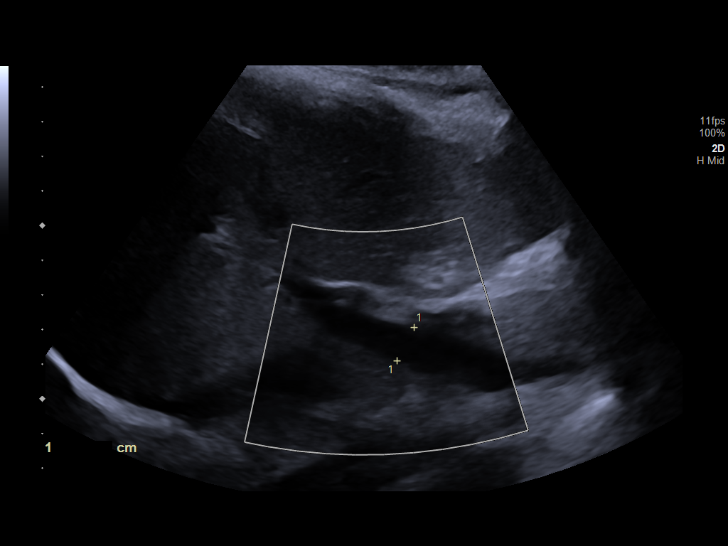

[14 of 25 positions shown; findings below may reference images not displayed]

FINDINGS: Gallbladder:

Surgically absent

Common bile duct:

Diameter: 4 mm, unremarkable

Liver:

No focal lesion identified. Within normal limits in parenchymal
echogenicity. No definite intrahepatic biliary ductal dilatation.
Portal vein is patent on color Doppler imaging with normal direction
of blood flow towards the liver.

Other: No ascites identified.
IMPRESSION: 1. Negative post cholecystectomy.

## 2023-07-22 ENCOUNTER — Encounter (HOSPITAL_BASED_OUTPATIENT_CLINIC_OR_DEPARTMENT_OTHER): Payer: Self-pay | Admitting: *Deleted

## 2023-07-22 ENCOUNTER — Emergency Department (HOSPITAL_BASED_OUTPATIENT_CLINIC_OR_DEPARTMENT_OTHER)
Admission: EM | Admit: 2023-07-22 | Discharge: 2023-07-22 | Discharge status: Against Medical Advice | Payer: BC Managed Care – PPO | Attending: Emergency Medicine | Admitting: Emergency Medicine

## 2023-07-22 ENCOUNTER — Emergency Department (HOSPITAL_BASED_OUTPATIENT_CLINIC_OR_DEPARTMENT_OTHER): Payer: BC Managed Care – PPO | Admitting: Radiology

## 2023-07-22 ENCOUNTER — Other Ambulatory Visit: Payer: Self-pay

## 2023-07-22 DIAGNOSIS — Z5321 Procedure and treatment not carried out due to patient leaving prior to being seen by health care provider: Secondary | ICD-10-CM | POA: Diagnosis not present

## 2023-07-22 DIAGNOSIS — R110 Nausea: Secondary | ICD-10-CM | POA: Insufficient documentation

## 2023-07-22 DIAGNOSIS — R079 Chest pain, unspecified: Secondary | ICD-10-CM | POA: Diagnosis present

## 2023-07-22 LAB — BASIC METABOLIC PANEL
Anion gap: 8 (ref 5–15)
BUN: 16 mg/dL (ref 6–20)
CO2: 24 mmol/L (ref 22–32)
Calcium: 9.3 mg/dL (ref 8.9–10.3)
Chloride: 104 mmol/L (ref 98–111)
Creatinine, Ser: 0.7 mg/dL (ref 0.44–1.00)
GFR, Estimated: >60 mL/min (ref >60)
Glucose, Bld: 107 mg/dL — ABNORMAL HIGH (ref 70–99)
Potassium: 3.9 mmol/L (ref 3.5–5.1)
Sodium: 136 mmol/L (ref 135–145)

## 2023-07-22 LAB — CBC
HCT: 36.2 % (ref 36.0–46.0)
Hemoglobin: 11.6 g/dL — ABNORMAL LOW (ref 12.0–15.0)
MCH: 23.9 pg — ABNORMAL LOW (ref 26.0–34.0)
MCHC: 32 g/dL (ref 30.0–36.0)
MCV: 74.6 fL — ABNORMAL LOW (ref 80.0–100.0)
Platelets: 240 10*3/uL (ref 150–400)
RBC: 4.85 MIL/uL (ref 3.87–5.11)
RDW: 14 % (ref 11.5–15.5)
WBC: 5.8 10*3/uL (ref 4.0–10.5)
nRBC: 0 % (ref 0.0–0.2)

## 2023-07-22 LAB — TROPONIN I (HIGH SENSITIVITY): Troponin I (High Sensitivity): <2 ng/L (ref <18)

## 2023-07-22 MED ORDER — ONDANSETRON HCL 4 MG/2ML IJ SOLN
4.0000 mg | Freq: Once | INTRAMUSCULAR | Status: AC
  Administered 2023-07-22: 4 mg via INTRAVENOUS
  Filled 2023-07-22: qty 2

## 2023-07-22 MED ORDER — ALUM & MAG HYDROXIDE-SIMETH 200-200-20 MG/5ML PO SUSP
30.0000 mL | Freq: Once | ORAL | Status: AC
  Administered 2023-07-22: 30 mL via ORAL
  Filled 2023-07-22: qty 30

## 2023-07-22 MED ORDER — LIDOCAINE VISCOUS HCL 2 % MT SOLN
15.0000 mL | Freq: Once | OROMUCOSAL | Status: AC
  Administered 2023-07-22: 15 mL via ORAL
  Filled 2023-07-22: qty 15

## 2023-07-22 NOTE — ED Notes (Signed)
Pt requested that her IV be removed so that she could leave due to long wait times. IV removed by this RN, pt ambulatory upon departure. NAD noted at this time.

## 2023-07-22 NOTE — ED Triage Notes (Signed)
Pt c/o centralized chest pain that woke her from sleep. Denies being sick; significant other at bedside reports pt has been feeling more tense. Currently feeling nauseous

## 2023-08-26 ENCOUNTER — Encounter (HOSPITAL_BASED_OUTPATIENT_CLINIC_OR_DEPARTMENT_OTHER): Payer: Self-pay | Admitting: Emergency Medicine

## 2023-08-26 ENCOUNTER — Emergency Department (HOSPITAL_BASED_OUTPATIENT_CLINIC_OR_DEPARTMENT_OTHER)
Admission: EM | Admit: 2023-08-26 | Discharge: 2023-08-26 | Disposition: A | Discharge status: Home / Self Care | Attending: Emergency Medicine | Admitting: Emergency Medicine

## 2023-08-26 ENCOUNTER — Other Ambulatory Visit: Payer: Self-pay

## 2023-08-26 DIAGNOSIS — J029 Acute pharyngitis, unspecified: Secondary | ICD-10-CM | POA: Diagnosis present

## 2023-08-26 LAB — GROUP A STREP BY PCR: Group A Strep by PCR: NOT DETECTED

## 2023-08-26 MED ORDER — IBUPROFEN 100 MG/5ML PO SUSP
600.0000 mg | Freq: Once | ORAL | Status: AC
  Administered 2023-08-26: 600 mg via ORAL
  Filled 2023-08-26: qty 30

## 2023-08-26 MED ORDER — DEXAMETHASONE SODIUM PHOSPHATE 10 MG/ML IJ SOLN
10.0000 mg | Freq: Once | INTRAMUSCULAR | Status: AC
  Administered 2023-08-26: 10 mg via INTRAMUSCULAR
  Filled 2023-08-26: qty 1

## 2023-08-26 NOTE — ED Provider Notes (Signed)
   Emergency Department Provider Note   I have reviewed the triage vital signs and the nursing notes.   HISTORY  Chief Complaint Sore Throat   HPI Olivia Morton is a 33 y.o. female with PMH reviewed below presents to the emergency for evaluation of sore throat.  Symptoms been going on for the past 3 days.  She has pain with swallowing but is able to speak and swallow saliva and other fluids without difficulty.  No shortness of breath. No dental pain. No fever or rash.    Past Medical History:  Diagnosis Date   PID (acute pelvic inflammatory disease) 2020    Review of Systems  Constitutional: No fever/chills ENT: Positive sore throat. Skin: Negative for rash. Neurological: Negative for headaches  ____________________________________________   PHYSICAL EXAM:  VITAL SIGNS: BP: 131/78 Pulse: 78 RR: 19 SpO2: 99% RA Temp: 98.9 F  Constitutional: Alert and oriented. Well appearing and in no acute distress. Eyes: Conjunctivae are normal.  Head: Atraumatic. Nose: No congestion/rhinnorhea. Mouth/Throat: Mucous membranes are moist.  Oropharynx with erythematous, mildly enlarged tonsils bilaterally. No evident PTA. Clear voice and managing oral secretions.  Neck: No stridor.   Cardiovascular: Normal rate, regular rhythm. Good peripheral circulation. Grossly normal heart sounds.   Respiratory: Normal respiratory effort.   Gastrointestinal: No distention.  Musculoskeletal: No gross deformities of extremities. Neurologic:  Normal speech and language.  Skin:  Skin is warm, dry and intact. No rash noted.   ____________________________________________   LABS (all labs ordered are listed, but only abnormal results are displayed)  Labs Reviewed  GROUP A STREP BY PCR   ____________________________________________   PROCEDURES  Procedure(s) performed:   Procedures  None  ____________________________________________   INITIAL IMPRESSION / ASSESSMENT AND PLAN / ED  COURSE  Pertinent labs & imaging results that were available during my care of the patient were reviewed by me and considered in my medical decision making (see chart for details).   This patient is Presenting for Evaluation of sore throat, which does require a range of treatment options, and is a complaint that involves a moderate risk of morbidity and mortality.  The Differential Diagnoses include viral pharyngitis, strep pharyngitis, PTA, mono, RPA, etc.   Clinical Laboratory Tests Ordered, included Strep PCR with was negative.   Medical Decision Making: Summary:  Patient presents emergency department with sore throat for the past 3 days.  Appears to have tonsillitis without visible abscess or hard signs to suspect deeper space neck infection.  Considered advanced imaging of the soft tissues of the neck but feel this is not indicated based on exam. Plan for strep PCR and reassess.   Reevaluation with update and discussion with patient. Plan for IM Decadron here to reduce swelling and symptoms. Plan for continued supportive care at home.   Patient's presentation is most consistent with acute, uncomplicated illness.   Disposition: discharge  ____________________________________________  FINAL CLINICAL IMPRESSION(S) / ED DIAGNOSES  Final diagnoses:  Viral pharyngitis    Note:  This document was prepared using Dragon voice recognition software and may include unintentional dictation errors.  Alona Bene, MD, [facility]    Kilian Schwartz, Arlyss Repress, MD 08/26/23 [phone]

## 2023-08-26 NOTE — ED Triage Notes (Signed)
 Pt reports sore throat x 3 days. States she thinks she has tonsillitis. She is able to manage her own secretions, and voice is clear. She states she is unable to swallow food and that her pain level is a 10. Denies fever.

## 2023-08-28 ENCOUNTER — Emergency Department (HOSPITAL_BASED_OUTPATIENT_CLINIC_OR_DEPARTMENT_OTHER)
Admission: EM | Admit: 2023-08-28 | Discharge: 2023-08-28 | Discharge status: Against Medical Advice | Source: Home / Self Care

## 2023-08-28 ENCOUNTER — Other Ambulatory Visit: Payer: Self-pay

## 2023-10-23 ENCOUNTER — Emergency Department (HOSPITAL_BASED_OUTPATIENT_CLINIC_OR_DEPARTMENT_OTHER)
Admission: EM | Admit: 2023-10-23 | Discharge: 2023-10-23 | Disposition: A | Discharge status: Home / Self Care | Attending: Emergency Medicine | Admitting: Emergency Medicine

## 2023-10-23 ENCOUNTER — Encounter (HOSPITAL_BASED_OUTPATIENT_CLINIC_OR_DEPARTMENT_OTHER): Payer: Self-pay | Admitting: Emergency Medicine

## 2023-10-23 ENCOUNTER — Other Ambulatory Visit: Payer: Self-pay

## 2023-10-23 DIAGNOSIS — O26851 Spotting complicating pregnancy, first trimester: Secondary | ICD-10-CM | POA: Insufficient documentation

## 2023-10-23 DIAGNOSIS — Z3A01 Less than 8 weeks gestation of pregnancy: Secondary | ICD-10-CM | POA: Insufficient documentation

## 2023-10-23 DIAGNOSIS — N939 Abnormal uterine and vaginal bleeding, unspecified: Secondary | ICD-10-CM

## 2023-10-23 LAB — CBC WITH DIFFERENTIAL/PLATELET
Abs Immature Granulocytes: 0.01 10*3/uL (ref 0.00–0.07)
Basophils Absolute: 0 10*3/uL (ref 0.0–0.1)
Basophils Relative: 0 %
Eosinophils Absolute: 0.1 10*3/uL (ref 0.0–0.5)
Eosinophils Relative: 1 %
HCT: 35.9 % — ABNORMAL LOW (ref 36.0–46.0)
Hemoglobin: 11.3 g/dL — ABNORMAL LOW (ref 12.0–15.0)
Immature Granulocytes: 0 %
Lymphocytes Relative: 33 %
Lymphs Abs: 1.9 10*3/uL (ref 0.7–4.0)
MCH: 23.5 pg — ABNORMAL LOW (ref 26.0–34.0)
MCHC: 31.5 g/dL (ref 30.0–36.0)
MCV: 74.8 fL — ABNORMAL LOW (ref 80.0–100.0)
Monocytes Absolute: 0.4 10*3/uL (ref 0.1–1.0)
Monocytes Relative: 7 %
Neutro Abs: 3.3 10*3/uL (ref 1.7–7.7)
Neutrophils Relative %: 59 %
Platelets: 272 10*3/uL (ref 150–400)
RBC: 4.8 MIL/uL (ref 3.87–5.11)
RDW: 14.5 % (ref 11.5–15.5)
Smear Review: NORMAL
WBC: 5.7 10*3/uL (ref 4.0–10.5)
nRBC: 0 % (ref 0.0–0.2)

## 2023-10-23 LAB — BASIC METABOLIC PANEL WITH GFR
Anion gap: 12 (ref 5–15)
BUN: 9 mg/dL (ref 6–20)
CO2: 23 mmol/L (ref 22–32)
Calcium: 9.3 mg/dL (ref 8.9–10.3)
Chloride: 104 mmol/L (ref 98–111)
Creatinine, Ser: 0.74 mg/dL (ref 0.44–1.00)
GFR, Estimated: >60 mL/min (ref >60)
Glucose, Bld: 95 mg/dL (ref 70–99)
Potassium: 4 mmol/L (ref 3.5–5.1)
Sodium: 139 mmol/L (ref 135–145)

## 2023-10-23 LAB — HCG, QUANTITATIVE, PREGNANCY: hCG, Beta Chain, Quant, S: <1 m[IU]/mL (ref <5)

## 2023-10-23 MED ORDER — ONDANSETRON 4 MG PO TBDP
4.0000 mg | ORAL_TABLET | Freq: Once | ORAL | Status: AC
  Administered 2023-10-23: 4 mg via ORAL
  Filled 2023-10-23: qty 1

## 2023-10-23 MED ORDER — ONDANSETRON 4 MG PO TBDP: 4.0000 mg | ORAL_TABLET | Freq: Three times a day (TID) | ORAL | 0 refills | Status: AC | PRN

## 2023-10-23 NOTE — Discharge Instructions (Addendum)
 Today you were seen for vaginal bleeding and cramping.  This is most likely your normal menses.  You have been sent Zofran  as needed for nausea and vomiting.  Please return to the ED if you have uncontrollable vomiting or fever that does not lower with Tylenol  and Motrin .  Thank you for letting us  treat you today. After reviewing your labs, I feel you are safe to go home. Please follow up with your PCP in the next several days and provide them with your records from this visit. Return to the Emergency Room if pain becomes severe or symptoms worsen.

## 2023-10-23 NOTE — ED Notes (Signed)
 RN provided AVS using Teachback Method. Patient verbalizes understanding of Discharge Instructions. Opportunity for Questioning and Answers were provided by RN. Patient Discharged from ED ambulatory to home via self.

## 2023-10-23 NOTE — ED Provider Notes (Signed)
 [place] [facility] Provider Note   CSN: [id number] Arrival date & time: 10/23/23  1431     History  Chief Complaint  Patient presents with   Vaginal Bleeding    Olivia Morton is a 33 y.o. female presents today for vaginal bleeding and mild cramping that started approximately 1 hour prior to arrival.  Patient is approximately [redacted] weeks pregnant, but is not scheduled to see OB/GYN until the 21st of this month.  Patient also endorses mild nausea and vomiting.  Patient denies fever, chills, vaginal discharge, diarrhea, constipation, chest pain, shortness of breath, dysuria, hematuria, any other complaints at this time.   Vaginal Bleeding      Home Medications Prior to Admission medications   Medication Sig Start Date End Date Taking? Authorizing Provider  ondansetron  (ZOFRAN -ODT) 4 MG disintegrating tablet Take 1 tablet (4 mg total) by mouth every 8 (eight) hours as needed for nausea or vomiting. 10/23/23  Yes Kanav Kazmierczak N, PA-C  acetaminophen  (TYLENOL ) 500 MG tablet Take 1,000 mg by mouth every 6 (six) hours as needed for moderate pain or headache.    [provider]  benzonatate  (TESSALON ) 100 MG capsule Take 1 capsule (100 mg total) by mouth every 8 (eight) hours. 10/16/22   Smoot, Genevive Ket, PA-C  erythromycin  ophthalmic ointment Place a 1/2 inch ribbon of ointment into the lower eyelid twice daily 05/11/23   Henderly, Britni A, PA-C  ondansetron  (ZOFRAN ) 4 MG tablet Take 1 tablet (4 mg total) by mouth every 6 (six) hours as needed for nausea or vomiting. 05/22/22   Adel Aden, PA-C  potassium chloride  SA (KLOR-CON  M) 20 MEQ tablet Take 1 tablet (20 mEq total) by mouth daily. 01/09/23   [place] Butter, PA  promethazine  (PHENERGAN ) 25 MG suppository Place 1 suppository (25 mg total) rectally every 6 (six) hours as needed for nausea or vomiting. 01/08/23   Eugenio Saenz Butter, PA      Allergies    Clindamycin /lincomycin and Grapeseed  extract [nutritional supplements]    Review of Systems   Review of Systems  Genitourinary:  Positive for pelvic pain and vaginal bleeding.    Physical Exam Updated Vital Signs BP 113/60 (BP Location: Right Arm)   Pulse 77   Temp (!) 97.5 F (36.4 C) (Oral)   Resp 16   Ht 5\' 7"  (1.702 m)   Wt 92.1 kg   LMP 08/29/2023 (Approximate)   SpO2 100%   BMI 31.79 kg/m  Physical Exam Vitals and nursing note reviewed.  Constitutional:      General: She is not in acute distress.    Appearance: Normal appearance. She is well-developed. She is not ill-appearing.  HENT:     Head: Normocephalic and atraumatic.     Right Ear: External ear normal.     Left Ear: External ear normal.     Mouth/Throat:     Mouth: Mucous membranes are moist.  Eyes:     Extraocular Movements: Extraocular movements intact.     Conjunctiva/sclera: Conjunctivae normal.  Cardiovascular:     Rate and Rhythm: Normal rate and regular rhythm.     Pulses: Normal pulses.     Heart sounds: Normal heart sounds. No murmur heard. Pulmonary:     Effort: Pulmonary effort is normal. No respiratory distress.     Breath sounds: Normal breath sounds.  Abdominal:     Palpations: Abdomen is soft.     Tenderness: There is no abdominal tenderness.  Genitourinary:  Comments: deferred Musculoskeletal:        General: No swelling.     Cervical back: Neck supple.  Skin:    General: Skin is warm and dry.     Capillary Refill: Capillary refill takes less than 2 seconds.  Neurological:     General: No focal deficit present.     Mental Status: She is alert.  Psychiatric:        Mood and Affect: Mood normal.     ED Results / Procedures / Treatments   Labs (all labs ordered are listed, but only abnormal results are displayed) Labs Reviewed  CBC WITH DIFFERENTIAL/PLATELET - Abnormal; Notable for the following components:      Result Value   Hemoglobin 11.3 (*)    HCT 35.9 (*)    MCV 74.8 (*)    MCH 23.5 (*)    All other  components within normal limits  BASIC METABOLIC PANEL WITH GFR  HCG, QUANTITATIVE, PREGNANCY    EKG None  Radiology No results found.  Procedures Procedures    Medications Ordered in ED Medications  ondansetron  (ZOFRAN -ODT) disintegrating tablet 4 mg (has no administration in time range)    ED Course/ Medical Decision Making/ A&P                                 Medical Decision Making Amount and/or Complexity of Data Reviewed Labs: ordered. Radiology: ordered.   This patient presents to the ED for concern of vaginal bleeding and cramping differential diagnosis includes ectopic pregnancy, intrauterine pregnancy, menses   Lab Tests:  I Ordered, and personally interpreted labs.  The pertinent results include: Mild anemia which is chronic per historical values, HCG <1   Problem List / ED Course:  Considered for admission or further workup however patient's vital signs, physical exam, and labs are reassuring.  Patient prescribed short course of Zofran  outpatient as needed for nausea and vomiting.  Patient given return precautions.  Patient to follow-up with OB/GYN.  Patient safe for discharge at this time.        Final Clinical Impression(s) / ED Diagnoses Final diagnoses:  Vaginal bleeding    Rx / DC Orders ED Discharge Orders          Ordered    ondansetron  (ZOFRAN -ODT) 4 MG disintegrating tablet  Every 8 hours PRN        10/23/23 1551              Carie Charity, PA-C 10/23/23 1553    Lowery Rue, DO 10/24/23 [phone]

## 2023-10-23 NOTE — ED Triage Notes (Signed)
 Pt had +preg test x 2; per LMP she is about 7wk 3d; started bleeding today (describes spotting); denies pain
